# Patient Record
Sex: Female | Born: 1950 | Race: Black or African American | Hispanic: No | Marital: Married | Smoking: Never smoker
Health system: Southern US, Community
[De-identification: ages and names within clinical notes are randomized; demographics above are authoritative.]

## PROBLEM LIST (undated history)

## (undated) DIAGNOSIS — K219 Gastro-esophageal reflux disease without esophagitis: Secondary | ICD-10-CM

## (undated) DIAGNOSIS — I10 Essential (primary) hypertension: Secondary | ICD-10-CM

## (undated) DIAGNOSIS — M1990 Unspecified osteoarthritis, unspecified site: Secondary | ICD-10-CM

## (undated) HISTORY — PX: CHOLECYSTECTOMY: SHX55

## (undated) HISTORY — PX: ABDOMINAL HYSTERECTOMY: SHX81

## (undated) HISTORY — PX: TONSILLECTOMY: SUR1361

---

## 1999-04-15 ENCOUNTER — Other Ambulatory Visit: Admission: RE | Admit: 1999-04-15 | Discharge: 1999-04-15 | Payer: Self-pay | Admitting: Gynecology

## 1999-10-15 ENCOUNTER — Encounter (INDEPENDENT_AMBULATORY_CARE_PROVIDER_SITE_OTHER): Payer: Self-pay

## 1999-10-15 ENCOUNTER — Inpatient Hospital Stay (HOSPITAL_COMMUNITY): Admission: RE | Admit: 1999-10-15 | Discharge: 1999-10-16 | Payer: Self-pay | Admitting: Gynecology

## 2000-01-25 ENCOUNTER — Encounter: Payer: Self-pay | Admitting: *Deleted

## 2000-01-25 ENCOUNTER — Encounter: Admission: RE | Admit: 2000-01-25 | Discharge: 2000-01-25 | Payer: Self-pay | Admitting: *Deleted

## 2000-07-27 ENCOUNTER — Ambulatory Visit (HOSPITAL_COMMUNITY): Admission: RE | Admit: 2000-07-27 | Discharge: 2000-07-27 | Payer: Self-pay | Admitting: Gastroenterology

## 2000-10-06 ENCOUNTER — Encounter: Payer: Self-pay | Admitting: *Deleted

## 2000-10-06 ENCOUNTER — Encounter: Admission: RE | Admit: 2000-10-06 | Discharge: 2000-10-06 | Payer: Self-pay | Admitting: *Deleted

## 2002-05-06 ENCOUNTER — Other Ambulatory Visit: Admission: RE | Admit: 2002-05-06 | Discharge: 2002-05-06 | Payer: Self-pay | Admitting: Family Medicine

## 2003-04-14 ENCOUNTER — Encounter: Payer: Self-pay | Admitting: Emergency Medicine

## 2003-04-14 ENCOUNTER — Emergency Department (HOSPITAL_COMMUNITY): Admission: EM | Admit: 2003-04-14 | Discharge: 2003-04-14 | Payer: Self-pay | Admitting: Emergency Medicine

## 2004-08-17 ENCOUNTER — Ambulatory Visit (HOSPITAL_COMMUNITY): Admission: RE | Admit: 2004-08-17 | Discharge: 2004-08-17 | Payer: Self-pay | Admitting: Gastroenterology

## 2005-09-19 HISTORY — PX: KNEE ARTHROPLASTY: SHX992

## 2006-03-31 ENCOUNTER — Inpatient Hospital Stay (HOSPITAL_COMMUNITY): Admission: RE | Admit: 2006-03-31 | Discharge: 2006-04-04 | Payer: Self-pay | Admitting: Specialist

## 2008-10-18 ENCOUNTER — Emergency Department (HOSPITAL_COMMUNITY): Admission: EM | Admit: 2008-10-18 | Discharge: 2008-10-18 | Payer: Self-pay | Admitting: Emergency Medicine

## 2008-11-29 ENCOUNTER — Emergency Department (HOSPITAL_COMMUNITY): Admission: EM | Admit: 2008-11-29 | Discharge: 2008-11-29 | Payer: Self-pay | Admitting: Family Medicine

## 2009-12-27 ENCOUNTER — Emergency Department (HOSPITAL_COMMUNITY): Admission: EM | Admit: 2009-12-27 | Discharge: 2009-12-27 | Payer: Self-pay | Admitting: Family Medicine

## 2010-12-21 ENCOUNTER — Other Ambulatory Visit: Payer: Self-pay | Admitting: Gastroenterology

## 2010-12-27 ENCOUNTER — Ambulatory Visit
Admission: RE | Admit: 2010-12-27 | Discharge: 2010-12-27 | Disposition: A | Discharge status: Home / Self Care | Payer: BLUE CROSS/BLUE SHIELD | Source: Ambulatory Visit | Attending: Gastroenterology | Admitting: Gastroenterology

## 2010-12-30 LAB — POCT RAPID STREP A (OFFICE): Streptococcus, Group A Screen (Direct): NEGATIVE

## 2011-02-04 NOTE — Discharge Summary (Signed)
[facility]  Patient:    Martha Chambers                       MRN: [id number] Adm. Date:  [id number] Disc. Date: [id number] Attending:  Merrily Pew Dictator:   Gwendolyn Fill. Gatewood, P.A.                           Discharge Summary  DISCHARGE DIAGNOSES:          1. Uterine prolapse.                               2. Cystocele.                               3. Leiomyomata.                               4. Status post total vaginal hysterectomy, anterior                                  colporrhaphy by Timothy P. Fontaine, M.D. on                                  October 15, 1999.  HISTORY OF PRESENT ILLNESS:   This is a 60 year old female, gravida 3, para 3, ith uterine prolapse, cystocele, increased symptoms of vaginal pressure, something falling out, deep dyspareunia, urinary retention.  Also was found to have uterus measuring 11.7 with subserosal fibroid measuring 8 cm.  The patient was admitted for definitive surgery.  HOSPITAL COURSE:              On October 15, 1999, the patient was admitted and  underwent a total vaginal hysterectomy, anterior colporrhaphy by Timothy P. Fontaine, M.D. without complications.  The patient remained afebrile, voiding, nd in stable condition and was discharged to home on October 16, 1999, and given [facility] Gynecology postoperative instructions as well as postoperative booklet.  ACCESSORY CLINICAL DATA:      On October 16, 1999, hemoglobin was 10.4, hematocrit 30.4.  DISPOSITION:                  The patient was discharged to home and given a prescription for Lortab-5 #15 p.r.n. pain and was to return to the office in two weeks for follow-up. DD:  11/01/99 TD:  11/01/99 Job: [id number]

## 2011-02-04 NOTE — Op Note (Signed)
[facility]  Patient:    Martha Chambers                       MRN: [id number] Proc. Date: 10/15/99 Adm. Date:  [id number] Attending:  Merrily Pew                           Operative Report  PREOPERATIVE DIAGNOSIS:           Uterine prolapse, cystocele, leiomyomata.  POSTOPERATIVE DIAGNOSIS:          Uterine prolapse, cystocele, leiomyomata.  OPERATION:                        Total vaginal hysterectomy, anterior  colporrhaphy.  SURGEON:                          Timothy P. Fontaine, M.D.  ASSISTANT:                        Rande Brunt. Eda Paschal, M.D.  ANESTHESIA:                       General endotracheal.  COMPLICATIONS:                    None.  ESTIMATED BLOOD LOSS:             500 cc.  SPECIMEN:                         Uterus.  FINDINGS:                         Enlarged uterus with changes consistent with leiomyomata, right ovary not visualized, palpably normal.  Left ovary visualized and normal.  PROCEDURE:                        The patient was taken to the operating room, placed in the dorsal lithotomy position, and received a perineal/vaginal preparation with Betadine scrub and Betadine solution.  The bladder was emptied  with in and out Foley catheterization.  The patient was draped in the usual fashion.  Using latex precautions throughout the case, the cervix was visualized with a weighted speculum, grasped with a tenaculum and circumferentially injected with lidocaine with epinephrine.  The cervical mucosa was then circumferentially sharply incised and the pericervical planes were developed with Mayo scissors. The posterior cul-de-sac was identified and sharply incised and a long weighted speculum was placed.  The anterior cul-de-sac was developed.  The right and left uterosacral ligaments were identified, clamped, cut and ligated using 0 Vicryl suture.  The anterior peritoneum was identified and sharply incised and  the anterior cul-de-sac was entered without difficulty.  The uterus was then progressively freed from its attachments through clamping, cutting and ligating of the parametrial cardinal ligaments using 0 Vicryl suture.  The right and left uterine vessels were identified and doubly ligated using 0 Vicryl suture.  Upon  attempts at delivery of the uterus, it became evident that morcellization was needed and, through progressive incisions into the uterus and delivery of the myometrial and leiomyomata fragments, the uterus was ultimately delivered and the right and left uterine/ovarian pedicles were clamped and cut, freeing the specimen. Examination  of the adnexa showed no clear right ovary, although palpably felt normal and the pedicle was doubly ligated using 0 Vicryl suture.  The left ovary and fallopian tube were visualized, although felt to be high and the decision was made not to attempt excision, noting that, visually, this was a very normal-appearing ovary and tube.  This pedicle was doubly ligated using 0 Vicryl suture.  The long weighted speculum was then removed.  The intestines were packed with a ______  sponge and the posterior vaginal cuff was run using 0 Vicryl suture in a running, interlocking stitch.  The bowel packing was removed, adequate hemostasis visualized at all pedicles and the vagina was laterally reapproximated anterior to posterior with interrupted figure-of-eight 0 Vicryl sutures.  The mid vagina was then grasped with Allis clamps and the anterior vaginal wall was sharply incised with Metzenbaum scissors progressively with traction from serial Allis clamp application.  The bladder was then sharply dissected from the anterior vaginal wall with a good development of a vesicovaginal plane.  The cystocele was then reduced through progressive imbrication reapproximating the vesicovaginal fascia using 2-0 Vicryl suture.  Subsequently, the excess vaginal mucosa  was excised and the vaginal mucosa was reapproximated using 2-0 Vicryl in a running  interlocking stitch anchoring this to the underlying fascia.  The cuff was then  completely closed using the 2-0 Vicryl.  The vagina was irrigated and plain gauze packing placed due to the patients multiple allergies.  A Foley catheter was introduced, clear free-flowing urine was noted.  The patient was awakened without difficulty and taken to the recovery room in good condition. DD:  10/15/99 TD:  10/17/99 Job: [id number]

## 2011-02-04 NOTE — Op Note (Signed)
NAME:  Martha Chambers, CORLISS NO.:  [id number]   MEDICAL RECORD NO.:  [id number]          PATIENT TYPE:  INP   LOCATION:  1517                         FACILITY:  [facility]   PHYSICIAN:  Erasmo Leventhal, M.D.DATE OF BIRTH:  22-Mar-1951   DATE OF PROCEDURE:  03/31/2006  DATE OF DISCHARGE:                                 OPERATIVE REPORT   PREOPERATIVE DIAGNOSIS:  Right knee end-stage osteoarthritis.   POSTOPERATIVE DIAGNOSIS:  Right knee severe end-stage osteoarthritis.   PROCEDURE:  Right total knee arthroplasty.   SURGEON:  Valma Cava, MD   ASSISTANT:  Jodene Nam, PA-C   ANESTHESIA:  Spinal.   ESTIMATED BLOOD LOSS:  Less than 100 mL.   DRAINS:  Two mini Hemovac.   COMPLICATIONS:  None.   TOURNIQUET TIME:  1 hour and 30 minutes at 350 mmHg.   DISPOSITION:  To PACU stable.   OPERATIVE IMPLANTS:  De[employer] & [employer], all  cemented.  Size 4 femur, size 3 tibia, 12.5 tibial insert, 35 patella.   OPERATIVE DETAILS:  The patient was counseled in the holding area, and  correct site was identified.  IV was started; antibiotics were given.  Taken  to the OR, placed supine.  Inguinal nerve block was also administered  preoperatively.  Placed in supine position, general anesthesia.  Well-padded  bump.  Foley catheter was placed utilizing sterile technique by the OR  circulating nurse.  The right lower extremity is elevated, prepped with  DuraPrep, and draped in a sterile fashion.  Exsanguinated with an Esmarch.  Tourniquet is inflated to 350 mm of mmHg.  Straight midline incision made  through the skin and subcutaneous tissue.  Small bleeders were coagulated.  Medial parapatellar arthrotomy was performed.  Proximal and medial tibia  soft tissue release was done.  The patella was retracted out of the way,  knee was flexed.  End-stage arthritis change, bone-on-bone.  Cruciate  ligaments were resected, starter hole made in the distal  femur.  Canal was  irrigated until the effluent was clear.  Intramedullary rod was gently  placed.  We chose a 5-degree valgus cut with a 10 mm cut off the distal  femur.  The distal femur was found to be a size #4.  Rotational marks were  made and cut to fit a size #4.  Medial and lateral menisci were removed  under direct visualization.  Geniculate vessels were coagulated.  Posterior  neurovascular structures were brought up and protected throughout the entire  case.  The tibial eminence was resected.  Osteophytes were removed from the  proximal tibia.  The proximal tibia was found to be a size 3.  Starter holes  made.  Step reamer was utilized.  Canal was irrigated until the effluent was  clear, and intramedullary rod was gently placed.  This was done after making  sure the canal was well irrigated and vented.  I chose a 2 mm cut based off  the lateral side at a 0-degree slope.  Posteromedial and posterolateral  femoral osteophytes were removed under direct visualization.  At this time,  the spacer blocks were okay in extension, but we were tight in flexion.  Based upon that, the knee was flexed with 2 more mm taken off the proximal  tibia.  We now had balance in flexion and extension.  The tibia was then  subluxed forward.  The tibial baseplate was applied, set for correct  rotational coverage.  Pins were placed.  Reamer and punch was applied.  The  femoral box cut was performed in standard fashion.  At this time, the size 4  femur, size 3 tibia, with a 10 insert, and we had good range of motion and  soft tissue balance.  Patella was found to be a size 35.  Appropriate amount  of bone was resected.  Locking holes were made.  Patellofemoral tracking was  anatomic.  All trials were then removed.  The knee was irrigated with  pulsatile lavage while the cement was mixed.  Utilizing modern cement  technique, all components were cemented in place.  Size 3 tibia, size 4  femur, with the 35  patella.  After the cement had cured, excess cement was  meticulously removed.  We did trials in 10 and 12.5, and the 12.5 mm tibial  insert.  We had full extension.  We were well-balanced in flexion and  extension gaps.  We were stable to varus and valgus stress.  The  __________track was anatomic.  The knee was then flexed.  Trial was removed  and the final 12.5 mm posterior stabilized rotating platform tibia insert  was implanted.  The knee was again rechecked, found to be well-balanced,  excellent condition and alignment, irrigated.  With pulsatile lavage,  finally placed in tibial implant.  Two mini Hemovac drains were placed and  sequential closure of layers done.  The arthrotomy with Vicryl, subcu  Vicryl, skin was closed with subcuticular Monocryl suture.  Steri-Strips  were applied, drain was hooked to suction.  Sterile dressing applied to the  knee.  Tourniquet had been deflated.  There was normal circulation in the  foot and ankle at the end of the case.  She was awakened and taken from the  operating room to recovery room in stable condition.   To help with surgical retraction, decision making, Mr. Layla Maw PA  assistance was needed throughout the entire case.           ______________________________  Erasmo Leventhal, M.D.     RAC/MEDQ  D:  03/31/2006  T:  03/31/2006  Job:  [id number]

## 2011-02-04 NOTE — H&P (Signed)
NAME:  Martha Chambers, [name]:  [id number]   MEDICAL RECORD NO.:  [id number]           PATIENT TYPE:   LOCATION:                                 FACILITY:   PHYSICIAN:  Erasmo Leventhal, M.D.DATE OF BIRTH:  23-Dec-1950   DATE OF ADMISSION:  03/31/2006  DATE OF DISCHARGE:                                HISTORY & PHYSICAL   CHIEF COMPLAINT:  End-stage osteoarthritis right knee.   BRIEF HISTORY:  This is a 60 year old lady with a history of osteoarthritis  of her right knee. She has had conservative treatment with cortisone  injections, viscosupplementation therapy, all without relief of her  symptoms. Due to her continued pain and difficulty with ambulation and daily  activities, she is now scheduled for total knee arthroplasty of the right  knee. The surgery, risks, and benefits of after care were discussed in  detail with the patient, questions provided and answered, and surgery to go  ahead as scheduled. She is to receive medical clearance from her medical  doctor, Dr. Candyce Churn.   PAST MEDICAL HISTORY:  Serious medical illnesses include hypertension and  reflux. Previous surgeries include hysterectomy, tonsillectomy, and  cholecystectomy.   CURRENT MEDICATIONS:  1.  Diovan/hydrochlorothiazide 320/25 mg one daily  2.  Vicodin one q.6h. p.r.n. pain.  3.  Aspirin 81 mg daily.  4.  Nexium 40 mg daily.   ALLERGIES:  SULFA with a rash; CODEINE with nausea and vomiting; CEFTIN with  nausea and vomiting; and CIPRO with a rash.   FAMILY HISTORY:  Positive for coronary artery disease, diabetes, and  sarcoidosis.   SOCIAL HISTORY:  The patient is married. She works as a Public house manager. She does not  smoke or drink.   REVIEW OF SYSTEMS:  CENTRAL NERVOUS SYSTEM:  Negative for headache, blurred  vision, or dizziness. PULMONARY: No shortness of breath, PND, or orthopnea.  CARDIOVASCULAR: Positive for palpitations with caffeine drinks. Negative for  chest pain.  GI: Positive for reflux. Negative ulcers. GU:  Negative for  urinary tract difficulty. MUSCULOSKELETAL: Positive as in HPI.   PHYSICAL EXAMINATION:  VITAL SIGNS: BP 110/76, respirations 16, pulse 82 and  regular.  GENERAL APPEARANCE:  This is an obese lady in no acute distress.  HEENT: Head is normocephalic. Nose patent. Ears patent. Pupils equal, round,  and reactive to light. Throat without injection.  NECK: Supple without adenopathy. Carotids are 2+ without bruits.  CHEST: Clear to auscultation. No rales or rhonchi. Respirations 16.  HEART: Regular rate and rhythm, 82 beats per minute without murmur.  ABDOMEN: Soft with active bowel sounds. No masses or organomegaly.  NEUROLOGIC: The patient is alert and oriented to time, place, and person.  Cranial nerves II-XII grossly intact.  EXTREMITIES: Within normal limits. The right knee shows varus deformity, 0  to 110 degrees range of motion. Dorsalis pedis and posterior tibialis pulses  are 1+ and sensation and circulation are intact.   X-rays show end-stage osteoarthritis, right knee.   IMPRESSION:  End-stage osteoarthritis, right knee.   PLAN:  Total knee arthroplasty, right knee.  Martha Chambers. Chabon, P.A.    ______________________________  Erasmo Leventhal, M.D.    SJC/MEDQ  D:  03/08/2006  T:  03/08/2006  Job:  [id number]

## 2011-02-04 NOTE — Discharge Summary (Signed)
NAME:  Martha Chambers, Martha Chambers NO.:  [id number]   MEDICAL RECORD NO.:  [id number]          PATIENT TYPE:  INP   LOCATION:  1517                         FACILITY:  [facility]   PHYSICIAN:  Erasmo Leventhal, M.D.DATE OF BIRTH:  02-09-1951   DATE OF ADMISSION:  03/31/2006  DATE OF DISCHARGE:  04/04/2006                                 DISCHARGE SUMMARY   ADMITTING DIAGNOSIS:  End-stage osteoarthritis right knee.   DISCHARGE DIAGNOSIS:  End-stage osteoarthritis right knee.   OPERATION:  Total knee arthroplasty right knee.   BRIEF HISTORY:  This is a 60 year old white lady with a history of  osteoarthritis of her right knee who failed conservative management.  After  discussion of treatment, benefits, risks, and options patient now scheduled  for total knee arthroplasty.  She has got medical clearance from Dr. Johnella Moloney, her medical doctor, and surgery will go ahead as scheduled.   LABORATORY VALUES:  Admission CBC within normal limits.  Hemoglobin and  hematocrit reached a low of 9.2 and 27.6 on the 16th.  Admission BMET within  normal limits with the exception of slightly high glucose at 108.  She had  mild hyponatremia on the 14th at 133 which was corrected by the 16th.  Ran  mildly elevated glucose throughout admission.  BUN was slightly low and  calcium slightly low through postoperative period.  Admission PT/PTT within  normal limits and INR was 2.1 at discharge.  Urinalysis negative.   HOSPITAL COURSE:  Patient tolerated the operative procedure well.  First  postoperative day patient was comfortable.  Vital signs were stable,  afebrile.  Hemovac was discontinued without difficulty.  Second  postoperative day vital signs were stable.  Temperature to 100.1.  Dressing  was changed.  Wound was benign.  Calves were negative.  Pulmonary toilet was  increased.  Third postoperative day she was feeling okay, had moderate pain.  Vital signs stable, afebrile.  Lungs  clear.  Heart sounds normal.  Bowel  sounds active.  Calves were negative.  Dressing was changed and the wound  was benign.  Plans were made for discharge in the a.m. if stable and on April 04, 2006 patient was feeling good.  Vital signs were stable.  Temperature to  a 99.7 max.  INR 2.1.  Lungs clear.  Heart sounds normal.  Bowel sounds  active.  Calves negative.  The wound benign.  The patient subsequently  discharged home for follow-up in the office.   CONDITION ON DISCHARGE:  Improved.   DISCHARGE MEDICATIONS:  1.  Mepergan Fortis one q.6h. p.r.n. pain.  2.  Robaxin 500 one p.o. q.8h. p.r.n. spasm.  3.  Trinsicon one b.i.d. for anemia.  4.  Coumadin per pharmacy protocol.   DISCHARGE INSTRUCTIONS:  She is to use her walker, do her physical therapy  as directed, return to the office in 10 days for recheck or sooner p.r.n.  problems.      [name]. Chabon, P.A.    ______________________________  Erasmo Leventhal, M.D.    SJC/MEDQ  D:  04/18/2006  T:  04/18/2006  Job:  [id number]

## 2011-02-04 NOTE — Op Note (Signed)
NAME:  Martha Chambers, Martha Chambers               ACCOUNT NO.:  [id number]   MEDICAL RECORD NO.:  [id number]          PATIENT TYPE:  AMB   LOCATION:  ENDO                         FACILITY:  [facility]   PHYSICIAN:  Jordan Hawks. Elnoria Howard, MD    DATE OF BIRTH:  Dec 12, 1950   DATE OF PROCEDURE:  08/17/2004  DATE OF DISCHARGE:                                 OPERATIVE REPORT   PROCEDURE PERFORMED:  Esophagogastroduodenoscopy.   ENDOSCOPIST:  Jordan Hawks. Elnoria Howard, MD   INSTRUMENT USED:  Olympus adult endoscope.   INDICATIONS FOR PROCEDURE:  For dysphagia.   PHYSICAL EXAMINATION:  LUNGS:  Clear to auscultation bilaterally.  CARDIOVASCULAR:  Regular rate and rhythm.  ABDOMEN:  Soft, nontender, nondistended.   MEDICATIONS:  Demerol 60 mg IV, Versed 6 mg IV.   CONSENT:  Informed consent was obtained from the patient, stating the risks  of bleeding, infection, perforation, medication reactions, and the risk of  death, all of which are not exclusive of any other complications that can  occur.   DESCRIPTION OF PROCEDURE:  After adequate sedation was achieved, the  endoscope was advanced from the oral cavity to the duodenum under direct  visualization without difficulty.  The duodenum appeared normal.  There was  no evidence of gross abnormalities upon withdrawal and inspection of the  gastric lumen. There was no evidence of any gross abnormalities in this  location, i.e. no evidence of any masses, ulcerations, erosions.  The  endoscope was retroflexed and there was no evidence of any abnormalities of  the cardia region.  The endoscope was straightened and withdrawn into the  esophagitis and the Z-line was noted to be at 38 cm.  No evidence of any  esophagitis or hiatal hernia.  The endoscope was then withdrawn from the  patient and the procedure was terminated.  The patient tolerated the  procedure well.   PLAN:  Continue with proton pump inhibitor twice a day and return to clinic.       PDH/MEDQ  D:  08/17/2004   T:  08/17/2004  Job:  [id number]

## 2011-02-04 NOTE — Procedures (Signed)
[facility]  Patient:    Martha Chambers, Martha Chambers                      MRN: [id number] Proc. Date: 07/27/00 Adm. Date:  [id number] Attending:  Charna Elizabeth CC:         Heather Roberts, M.D.   Procedure Report  DATE OF BIRTH:  03-Feb-1951  PROCEDURE PERFORMED:  Colonoscopy.  ENDOSCOPIST:  Anselmo Rod, M.D.  INSTRUMENT USED:  Olympus video colonoscope.  INDICATIONS FOR PROCEDURE:  Rectal bleeding with diarrhea in a 60 year old African-American female, rule out colon polyps, masses, hemorrhoids, etc.  PREPROCEDURE PREPARATION:  Informed consent was procured from the patient. The patient was fasted for eight hours prior to the procedure, and prepped with a bottle of magnesium citrate and a gallon of NuLytely the night prior to the procedure.  PREPROCEDURE PHYSICAL:  VITAL SIGNS:  Stable.  NECK:  Supple.  CHEST:  Clear to auscultation, S1 and S2 regular.  ABDOMEN:  Soft with normal bowel sounds.  DESCRIPTION OF PROCEDURE:  The patient was placed in the left lateral decubitus position, and sedated with 90 mg of Demerol and 9 mg of Versed intravenously.  Once the patient was adequately sedated, maintained on low flow oxygen and continuous cardiac monitoring, the Olympus video colonoscope was advanced from the rectum to the cecum with difficulty secondary to a large amount of residual stool in the colon, especially in the right colon.  There were small nonbleeding internal hemorrhoids seen on retroflexion.  No large masses or polyps were seen.  A very small lesion may have been missed secondary to relatively poor prep.  IMPRESSION: 1. Essentially unrevealing colonoscopy with some residual stool in the colon,    especially on the right side. 2. Small nonbleeding internal hemorrhoids. 3. No large masses or polyps seen.  RECOMMENDATIONS: 1. The patient has been advised to increase the fluid and fiber in her diet. 2. Outpatient follow up is  advised in the next two weeks.  Further    recommendations ______ at that time. DD:  07/27/00 TD:  07/28/00 Job: [id number]

## 2011-08-30 ENCOUNTER — Encounter (HOSPITAL_COMMUNITY): Payer: Self-pay | Admitting: Pharmacy Technician

## 2011-09-08 NOTE — H&P (Signed)
  [facility] [address]   [address] [place], [place] [address] [phone] A Division of [facility]  Daniel F. Murphy, M.D.     Robert A. Wainer, M.D.     Anna Voytek, M.D. Joshua P. Landau, M.D.    [place] R. Ibazebo, M.D. Jafar Poffenberger S. Kramer, M.D. Timothy R. Draper, D.O.          Brock Larmon M. Lynk Marti, PA-C            Kirstin A. Shepperson, PA-C Brandon Parry, OPA-C   RE: Robitaille, Bisma                                [id number]      DOB: 04/18/1951 PROGRESS NOTE: 09-06-11 Chief complaint: Left knee pain.  History of present illness: [age] year-old black female with a history of end stage DJD, left knee, and chronic pain.  Returns.  States that symptoms are unchanged from previous visit.  She is wanting to proceed with total knee replacement as scheduled.   Current medications: Omeprazole, Diovan, HCT and Flexeril. Allergies: Ceftin (rash), Cipro (rash), Codeine (nausea and vomiting, Sulfa (rash), ACE inhibitors (cough) and Advil (indigestion).      Past medical/surgical history: Hypertension, diverticulosis, obesity, bilateral knee DJD, left breast biopsy, back pain and GERD. Review of systems: Patient denies lightheadedness, dizziness, fevers, chills, cardiac, pulmonary, GI, GU or neuro issues.   Family history: Positive for diabetes and cancer.  Social history: Does not smoke or drink.  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     EXAMINATION: Height: 5?4.  Weight: 254 pounds.  Respirations: 24.  Temperature: 98.4.  Blood pressure: 140/86.  Pulse: 86.  Pleasant black female, alert and oriented x 3 and in no acute distress.  Gait is normal.  Head is normocephalic, a traumatic.  PERRLA, EOMI.  Lungs: CTA bilaterally.  No wheezes.  Heart: RRR.  S1 and S2.  No murmurs noted.  Cervical spine unremarkable.  Abdomen: Round and non-distended.  NBS x 4.  Soft and non-tender.  Left knee: Decreased range of motion.   Positive crepitus.  Joint line tender.  1-2+ effusion.  Ligaments stable.  Bilateral calves non-tender.  Neurovascularly intact.  Skin warm and dry.   IMPRESSION: End stage DJD, left knee, and chronic pain.    PLAN:  We will proceed with left total knee replacement as scheduled.  Surgical procedure, along with potential rehab/recovery time discussed.  All questions answered.    Daniel F. Murphy, M.D.   Electronically verified by Daniel F. Murphy, M.D. DFM(JMO):jjh D 09-06-11 T 09-07-11 

## 2011-09-09 ENCOUNTER — Encounter (HOSPITAL_COMMUNITY)
Admission: RE | Admit: 2011-09-09 | Discharge: 2011-09-09 | Disposition: A | Discharge status: Home / Self Care | Payer: BC Managed Care – PPO | Source: Ambulatory Visit | Attending: Surgery | Admitting: Surgery

## 2011-09-09 ENCOUNTER — Encounter (HOSPITAL_COMMUNITY): Payer: Self-pay

## 2011-09-09 ENCOUNTER — Encounter (HOSPITAL_COMMUNITY)
Admission: RE | Admit: 2011-09-09 | Discharge: 2011-09-09 | Disposition: A | Discharge status: Home / Self Care | Payer: BC Managed Care – PPO | Source: Ambulatory Visit | Attending: Orthopedic Surgery | Admitting: Orthopedic Surgery

## 2011-09-09 ENCOUNTER — Other Ambulatory Visit: Payer: Self-pay

## 2011-09-09 HISTORY — DX: Gastro-esophageal reflux disease without esophagitis: K21.9

## 2011-09-09 HISTORY — DX: Essential (primary) hypertension: I10

## 2011-09-09 HISTORY — DX: Unspecified osteoarthritis, unspecified site: M19.90

## 2011-09-09 LAB — TYPE AND SCREEN
ABO/RH(D): O POS
Antibody Screen: NEGATIVE

## 2011-09-09 LAB — COMPREHENSIVE METABOLIC PANEL
AST: 15 U/L (ref 0–37)
CO2: 25 mEq/L (ref 19–32)
Calcium: 9.4 mg/dL (ref 8.4–10.5)
Creatinine, Ser: 0.59 mg/dL (ref 0.50–1.10)
GFR calc non Af Amer: >90 mL/min (ref >90)
Total Protein: 7.1 g/dL (ref 6.0–8.3)

## 2011-09-09 LAB — CBC
MCH: 29.9 pg (ref 26.0–34.0)
MCHC: 33.3 g/dL (ref 30.0–36.0)
MCV: 89.7 fL (ref 78.0–100.0)
Platelets: 310 10*3/uL (ref 150–400)
RBC: 4.35 MIL/uL (ref 3.87–5.11)
RDW: 13.3 % (ref 11.5–15.5)

## 2011-09-09 LAB — PROTIME-INR: INR: 1.08 (ref 0.00–1.49)

## 2011-09-09 LAB — URINALYSIS, ROUTINE W REFLEX MICROSCOPIC
Glucose, UA: NEGATIVE mg/dL
Hgb urine dipstick: NEGATIVE
Ketones, ur: NEGATIVE mg/dL
Protein, ur: NEGATIVE mg/dL

## 2011-09-09 LAB — URINE MICROSCOPIC-ADD ON

## 2011-09-09 MED ORDER — CHLORHEXIDINE GLUCONATE 4 % EX LIQD: 60.0000 mL | Freq: Once | CUTANEOUS | Status: DC

## 2011-09-09 NOTE — Pre-Procedure Instructions (Signed)
[name]  09/09/2011   Your procedure is scheduled on:09/21/11  Report to [facility] a800 AM.  Call this number if you have problems the morning of surgery: [phone]   Remember:   Do not eat food:After Midnight.  May have clear liquids: up to 4 Hours before arrival.  Clear liquids include soda, tea, black coffee, apple or grape juice, broth.  Take these medicines the morning of surgery with A SIP OF WATER: prilosec, ultram   Stop asa ,naprosyn 09/14/11   Do not wear jewelry, make-up or nail polish.  Do not wear lotions, powders, or perfumes. You may wear deodorant.  Do not shave 48 hours prior to surgery.  Do not bring valuables to the hospital.  Contacts, dentures or bridgework may not be worn into surgery.  Leave suitcase in the car. After surgery it may be brought to your room.  For patients admitted to the hospital, checkout time is 11:00 AM the day of discharge.   Patients discharged the day of surgery will not be allowed to drive home.  Name and phone number of your driver: Christen Bame [phone]  Special Instructions: CHG Shower Use Special Wash: 1/2 bottle night before surgery and 1/2 bottle morning of surgery.   Please read over the following fact sheets that you were given: Pain Booklet, Coughing and Deep Breathing, Blood Transfusion Information, MRSA Information and Surgical Site Infection Prevention

## 2011-09-09 NOTE — Consult Note (Addendum)
Anesthesia:  Patient is a 60 year old female scheduled for a left TKA on 09/21/11 by Dr. Eulah Pont.  Her history includes HTN, obesity, GERD, and OA.  She is a non-smoker.    Her labs are unremarkable except her urine has moderate leukocytes, but no nitrites.  Her CXR report showed no acute findings.  I was asked to review her preoperative EKG showing possible anterior infarct, poor R wave progression.  This appears new since 04/14/03. She did not report any CV symptoms to her PAT nurse.  I reviewed her history and EKG with Anesthesiologist Dr. Ivin Booty.  It is possible that her EKG changes are related to lead placement, however, she does have a few CV risk factors.  Anesthesia is requesting either her EKG be repeated to confirm results or that she be referred to a Cardiologist preoperatively to determine if additional testing is warranted.  I discussed the above options with Olegario Messier at Dr. Greig Right office.   She arranged an appointment for the patient with her PCP Dr. Johnella Moloney on 09/14/11 at 11:30AM.  She will get her EKG repeated at that visit.  Additional recommendations depending on those results.     Addendum: 09/19/11 0940  As planned, patient was seen by Dr. Kevan Ny on 09/14/11.  Her EKG looked similar to the EKG done here on 09/09/11.  Subsequently, he had her get an echocardiogram which showed an EF of 64.6%, mild LVH, trace MR/PR, mild TR, findings suggestive of grade 1 diastolic dysfunction without elevated LA pressure.  I had Anesthesiologist Dr. Katrinka Blazing review these results.  If Ms. Mckiver remains asymptomatic then plan to proceed.  Sherri at Dr. Greig Right office updated.

## 2011-09-09 NOTE — Progress Notes (Addendum)
Called dr Ezekiel Ina gates office req prior ekg. Message left on machine. Clydie Braun returned call. No ekg on record.chart left for anesthesia to review.

## 2011-09-15 NOTE — H&P (Signed)
  [facility] [address]   [address] [place], [place] [address] [phone] A Division of [facility]  Loreta Ave, M.D.     Robert A. Thurston Hole, M.D.     Lunette Stands, M.D. Eulas Post, M.D.    Buford Dresser, M.D. Estell Harpin, M.D. Ralene Cork, D.O.          Genene Churn. Barry Dienes, PA-C            Kirstin A. Shepperson, PA-C [place], OPA-C   RE: Martha Chambers, Martha Chambers                                [phone]      DOB: 1950-11-15 PROGRESS NOTE: 09-06-11 Chief complaint: Left knee pain.  History of present illness: 25 two year-old black female with a history of end stage DJD, left knee, and chronic pain.  Returns.  States that symptoms are unchanged from previous visit.  She is wanting to proceed with total knee replacement as scheduled.   Current medications: Omeprazole, Diovan, HCT and Flexeril. Allergies: Ceftin (rash), Cipro (rash), Codeine (nausea and vomiting, Sulfa (rash), ACE inhibitors (cough) and Advil (indigestion).      Past medical/surgical history: Hypertension, diverticulosis, obesity, bilateral knee DJD, left breast biopsy, back pain and GERD. Review of systems: Patient denies lightheadedness, dizziness, fevers, chills, cardiac, pulmonary, GI, GU or neuro issues.   Family history: Positive for diabetes and cancer.  Social history: Does not smoke or drink.  Patient is married and employed as an Public house manager at [employer].     EXAMINATION: Height: 5?4.  Weight: 254 pounds.  Respirations: 24.  Temperature: 98.4.  Blood pressure: 140/86.  Pulse: 86.  Pleasant black female, alert and oriented x 3 and in no acute distress.  Gait is normal.  Head is normocephalic, a traumatic.  PERRLA, EOMI.  Lungs: CTA bilaterally.  No wheezes.  Heart: RRR.  S1 and S2.  No murmurs noted.  Cervical spine unremarkable.  Abdomen: Round and non-distended.  NBS x 4.  Soft and non-tender.  Left knee: Decreased range of motion.   Positive crepitus.  Joint line tender.  1-2+ effusion.  Ligaments stable.  Bilateral calves non-tender.  Neurovascularly intact.  Skin warm and dry.   IMPRESSION: End stage DJD, left knee, and chronic pain.    PLAN:  We will proceed with left total knee replacement as scheduled.  Surgical procedure, along with potential rehab/recovery time discussed.  All questions answered.    Loreta Ave, M.D.   Electronically verified by Loreta Ave, M.D. DFM(JMO):jjh D 09-06-11 T 09-07-11

## 2011-09-20 MED ORDER — VANCOMYCIN HCL 1000 MG IV SOLR
1500.0000 mg | INTRAVENOUS | Status: AC
  Administered 2011-09-21: 1500 mg via INTRAVENOUS
  Filled 2011-09-20 (×2): qty 1500

## 2011-09-21 ENCOUNTER — Encounter (HOSPITAL_COMMUNITY): Payer: Self-pay | Admitting: Vascular Surgery

## 2011-09-21 ENCOUNTER — Inpatient Hospital Stay (HOSPITAL_COMMUNITY)
Admission: RE | Admit: 2011-09-21 | Discharge: 2011-09-23 | DRG: 209 | Disposition: A | Discharge status: Home Health | Payer: BC Managed Care – PPO | Source: Ambulatory Visit | Attending: Orthopedic Surgery | Admitting: Orthopedic Surgery

## 2011-09-21 ENCOUNTER — Inpatient Hospital Stay (HOSPITAL_COMMUNITY): Payer: BC Managed Care – PPO | Admitting: Vascular Surgery

## 2011-09-21 ENCOUNTER — Encounter (HOSPITAL_COMMUNITY): Payer: Self-pay

## 2011-09-21 ENCOUNTER — Encounter (HOSPITAL_COMMUNITY)
Admission: RE | Disposition: A | Discharge status: Home Health | Payer: Self-pay | Source: Ambulatory Visit | Attending: Orthopedic Surgery

## 2011-09-21 ENCOUNTER — Inpatient Hospital Stay (HOSPITAL_COMMUNITY): Payer: BC Managed Care – PPO

## 2011-09-21 DIAGNOSIS — I10 Essential (primary) hypertension: Secondary | ICD-10-CM | POA: Diagnosis present

## 2011-09-21 DIAGNOSIS — Z886 Allergy status to analgesic agent status: Secondary | ICD-10-CM

## 2011-09-21 DIAGNOSIS — Z96659 Presence of unspecified artificial knee joint: Secondary | ICD-10-CM

## 2011-09-21 DIAGNOSIS — M1710 Unilateral primary osteoarthritis, unspecified knee: Principal | ICD-10-CM | POA: Diagnosis present

## 2011-09-21 DIAGNOSIS — Z888 Allergy status to other drugs, medicaments and biological substances status: Secondary | ICD-10-CM

## 2011-09-21 DIAGNOSIS — Z4789 Encounter for other orthopedic aftercare: Secondary | ICD-10-CM

## 2011-09-21 DIAGNOSIS — G8929 Other chronic pain: Secondary | ICD-10-CM | POA: Diagnosis present

## 2011-09-21 DIAGNOSIS — Z882 Allergy status to sulfonamides status: Secondary | ICD-10-CM

## 2011-09-21 HISTORY — PX: TOTAL KNEE ARTHROPLASTY: SHX125

## 2011-09-21 SURGERY — ARTHROPLASTY, KNEE, TOTAL
Anesthesia: General | Site: Knee | Laterality: Left | Wound class: Clean

## 2011-09-21 MED ORDER — NEOSTIGMINE METHYLSULFATE 1 MG/ML IJ SOLN
INTRAMUSCULAR | Status: DC | PRN
  Administered 2011-09-21: 5 mg via INTRAVENOUS

## 2011-09-21 MED ORDER — MORPHINE SULFATE (PF) 1 MG/ML IV SOLN
INTRAVENOUS | Status: DC
  Administered 2011-09-21: 14:00:00 via INTRAVENOUS
  Administered 2011-09-21: 4.5 mg via INTRAVENOUS
  Filled 2011-09-21: qty 25

## 2011-09-21 MED ORDER — FENTANYL CITRATE 0.05 MG/ML IJ SOLN
50.0000 ug | INTRAMUSCULAR | Status: DC | PRN
  Administered 2011-09-21: 50 ug via INTRAVENOUS

## 2011-09-21 MED ORDER — ONDANSETRON HCL 4 MG/2ML IJ SOLN: 4.0000 mg | Freq: Four times a day (QID) | INTRAMUSCULAR | Status: DC | PRN

## 2011-09-21 MED ORDER — ONDANSETRON HCL 4 MG PO TABS: 4.0000 mg | ORAL_TABLET | Freq: Four times a day (QID) | ORAL | Status: DC | PRN

## 2011-09-21 MED ORDER — ACETAMINOPHEN 10 MG/ML IV SOLN
INTRAVENOUS | Status: DC | PRN
  Administered 2011-09-21: 1000 mg via INTRAVENOUS

## 2011-09-21 MED ORDER — OLMESARTAN MEDOXOMIL 40 MG PO TABS
40.0000 mg | ORAL_TABLET | Freq: Every day | ORAL | Status: DC
  Administered 2011-09-21 – 2011-09-22 (×2): 40 mg via ORAL
  Filled 2011-09-21 (×3): qty 1

## 2011-09-21 MED ORDER — ONDANSETRON HCL 4 MG/2ML IJ SOLN: 4.0000 mg | Freq: Once | INTRAMUSCULAR | Status: DC | PRN

## 2011-09-21 MED ORDER — FENTANYL CITRATE 0.05 MG/ML IJ SOLN
INTRAMUSCULAR | Status: DC | PRN
  Administered 2011-09-21 (×2): 50 ug via INTRAVENOUS
  Administered 2011-09-21: 200 ug via INTRAVENOUS

## 2011-09-21 MED ORDER — HYDROMORPHONE HCL PF 1 MG/ML IJ SOLN: 0.2500 mg | INTRAMUSCULAR | Status: DC | PRN

## 2011-09-21 MED ORDER — MORPHINE SULFATE (PF) 1 MG/ML IV SOLN
INTRAVENOUS | Status: DC
  Administered 2011-09-21: 15 mg via INTRAVENOUS
  Administered 2011-09-21: 12 mg via INTRAVENOUS
  Administered 2011-09-22: 3.7 mg via INTRAVENOUS
  Administered 2011-09-22: 10.5 mg via INTRAVENOUS
  Filled 2011-09-21: qty 25

## 2011-09-21 MED ORDER — VANCOMYCIN HCL IN DEXTROSE 1-5 GM/200ML-% IV SOLN
1000.0000 mg | Freq: Two times a day (BID) | INTRAVENOUS | Status: AC
  Administered 2011-09-21 – 2011-09-22 (×2): 1000 mg via INTRAVENOUS
  Filled 2011-09-21 (×2): qty 200

## 2011-09-21 MED ORDER — HYDROCHLOROTHIAZIDE 12.5 MG PO CAPS
12.5000 mg | ORAL_CAPSULE | Freq: Every day | ORAL | Status: DC
  Administered 2011-09-21 – 2011-09-22 (×2): 12.5 mg via ORAL
  Filled 2011-09-21 (×3): qty 1

## 2011-09-21 MED ORDER — METHOCARBAMOL 100 MG/ML IJ SOLN
500.0000 mg | Freq: Four times a day (QID) | INTRAVENOUS | Status: DC | PRN
  Filled 2011-09-21: qty 5

## 2011-09-21 MED ORDER — LIDOCAINE HCL (CARDIAC) 20 MG/ML IV SOLN
INTRAVENOUS | Status: DC | PRN
  Administered 2011-09-21: 100 mg via INTRAVENOUS

## 2011-09-21 MED ORDER — GLYCOPYRROLATE 0.2 MG/ML IJ SOLN
INTRAMUSCULAR | Status: DC | PRN
  Administered 2011-09-21: .8 mg via INTRAVENOUS

## 2011-09-21 MED ORDER — WARFARIN SODIUM 10 MG PO TABS
10.0000 mg | ORAL_TABLET | Freq: Once | ORAL | Status: AC
  Administered 2011-09-21: 10 mg via ORAL
  Filled 2011-09-21: qty 1

## 2011-09-21 MED ORDER — DIPHENHYDRAMINE HCL 12.5 MG/5ML PO ELIX
12.5000 mg | ORAL_SOLUTION | Freq: Four times a day (QID) | ORAL | Status: DC | PRN
  Administered 2011-09-21: 12.5 mg via ORAL
  Filled 2011-09-21: qty 5

## 2011-09-21 MED ORDER — BUPIVACAINE HCL (PF) 0.25 % IJ SOLN
INTRAMUSCULAR | Status: DC | PRN
  Administered 2011-09-21: 30 mL

## 2011-09-21 MED ORDER — BUPIVACAINE-EPINEPHRINE PF 0.5-1:200000 % IJ SOLN
INTRAMUSCULAR | Status: DC | PRN
  Administered 2011-09-21: 20 mL

## 2011-09-21 MED ORDER — FENTANYL CITRATE 0.05 MG/ML IJ SOLN
INTRAMUSCULAR | Status: AC
  Filled 2011-09-21: qty 2

## 2011-09-21 MED ORDER — PATIENT'S GUIDE TO USING COUMADIN BOOK
Freq: Once | Status: AC
  Administered 2011-09-21: 17:00:00
  Filled 2011-09-21: qty 1

## 2011-09-21 MED ORDER — ONDANSETRON HCL 4 MG/2ML IJ SOLN
INTRAMUSCULAR | Status: DC | PRN
  Administered 2011-09-21: 4 mg via INTRAVENOUS

## 2011-09-21 MED ORDER — ACETAMINOPHEN 325 MG PO TABS: 650.0000 mg | ORAL_TABLET | Freq: Four times a day (QID) | ORAL | Status: DC | PRN

## 2011-09-21 MED ORDER — ROCURONIUM BROMIDE 100 MG/10ML IV SOLN
INTRAVENOUS | Status: DC | PRN
  Administered 2011-09-21: 50 mg via INTRAVENOUS

## 2011-09-21 MED ORDER — POTASSIUM CHLORIDE IN NACL 20-0.9 MEQ/L-% IV SOLN
INTRAVENOUS | Status: DC
  Administered 2011-09-21: 17:00:00 via INTRAVENOUS
  Filled 2011-09-21 (×6): qty 1000

## 2011-09-21 MED ORDER — VECURONIUM BROMIDE 10 MG IV SOLR
INTRAVENOUS | Status: DC | PRN
  Administered 2011-09-21: 5 mg via INTRAVENOUS

## 2011-09-21 MED ORDER — SODIUM CHLORIDE 0.9 % IR SOLN
Status: DC | PRN
  Administered 2011-09-21: 1000 mL

## 2011-09-21 MED ORDER — MENTHOL 3 MG MT LOZG: 1.0000 | LOZENGE | OROMUCOSAL | Status: DC | PRN

## 2011-09-21 MED ORDER — MORPHINE SULFATE 4 MG/ML IJ SOLN
INTRAMUSCULAR | Status: DC | PRN
  Administered 2011-09-21: 4 mg via INTRAVENOUS

## 2011-09-21 MED ORDER — ACETAMINOPHEN 10 MG/ML IV SOLN
INTRAVENOUS | Status: AC
  Filled 2011-09-21: qty 100

## 2011-09-21 MED ORDER — NALOXONE HCL 0.4 MG/ML IJ SOLN: 0.4000 mg | INTRAMUSCULAR | Status: DC | PRN

## 2011-09-21 MED ORDER — DOCUSATE SODIUM 100 MG PO CAPS
100.0000 mg | ORAL_CAPSULE | Freq: Two times a day (BID) | ORAL | Status: DC
  Administered 2011-09-21 – 2011-09-23 (×4): 100 mg via ORAL
  Filled 2011-09-21 (×6): qty 1

## 2011-09-21 MED ORDER — MIDAZOLAM HCL 5 MG/5ML IJ SOLN
INTRAMUSCULAR | Status: DC | PRN
  Administered 2011-09-21: 1 mg via INTRAVENOUS

## 2011-09-21 MED ORDER — PHENOL 1.4 % MT LIQD
1.0000 | OROMUCOSAL | Status: DC | PRN
  Filled 2011-09-21: qty 177

## 2011-09-21 MED ORDER — SODIUM CHLORIDE 0.9 % IJ SOLN: 9.0000 mL | INTRAMUSCULAR | Status: DC | PRN

## 2011-09-21 MED ORDER — WARFARIN VIDEO: Freq: Once | Status: DC

## 2011-09-21 MED ORDER — ACETAMINOPHEN 650 MG RE SUPP: 650.0000 mg | Freq: Four times a day (QID) | RECTAL | Status: DC | PRN

## 2011-09-21 MED ORDER — PROPOFOL 10 MG/ML IV EMUL
INTRAVENOUS | Status: DC | PRN
  Administered 2011-09-21: 200 mg via INTRAVENOUS

## 2011-09-21 MED ORDER — VALSARTAN-HYDROCHLOROTHIAZIDE 320-12.5 MG PO TABS: 1.0000 | ORAL_TABLET | Freq: Every day | ORAL | Status: DC

## 2011-09-21 MED ORDER — FLEET ENEMA 7-19 GM/118ML RE ENEM: 1.0000 | ENEMA | Freq: Once | RECTAL | Status: AC | PRN

## 2011-09-21 MED ORDER — LACTATED RINGERS IV SOLN
INTRAVENOUS | Status: DC | PRN
  Administered 2011-09-21 (×2): via INTRAVENOUS

## 2011-09-21 MED ORDER — DIPHENHYDRAMINE HCL 50 MG/ML IJ SOLN
12.5000 mg | Freq: Four times a day (QID) | INTRAMUSCULAR | Status: DC | PRN
  Administered 2011-09-21 – 2011-09-22 (×2): 12.5 mg via INTRAVENOUS
  Filled 2011-09-21 (×2): qty 1

## 2011-09-21 MED ORDER — PANTOPRAZOLE SODIUM 40 MG PO TBEC
40.0000 mg | DELAYED_RELEASE_TABLET | Freq: Every day | ORAL | Status: DC
  Administered 2011-09-21 – 2011-09-23 (×3): 40 mg via ORAL
  Filled 2011-09-21 (×3): qty 1

## 2011-09-21 MED ORDER — METHOCARBAMOL 500 MG PO TABS
500.0000 mg | ORAL_TABLET | Freq: Four times a day (QID) | ORAL | Status: DC | PRN
  Administered 2011-09-22 (×3): 500 mg via ORAL
  Filled 2011-09-21 (×4): qty 1

## 2011-09-21 MED ORDER — METHOCARBAMOL 100 MG/ML IJ SOLN
500.0000 mg | INTRAVENOUS | Status: AC
  Administered 2011-09-21: 500 mg via INTRAVENOUS
  Filled 2011-09-21: qty 5

## 2011-09-21 MED ORDER — ENOXAPARIN SODIUM 30 MG/0.3ML ~~LOC~~ SOLN
30.0000 mg | Freq: Two times a day (BID) | SUBCUTANEOUS | Status: DC
  Administered 2011-09-22 (×2): 30 mg via SUBCUTANEOUS
  Filled 2011-09-21 (×5): qty 0.3

## 2011-09-21 SURGICAL SUPPLY — 55 items
BANDAGE ESMARK 6X9 LF (GAUZE/BANDAGES/DRESSINGS) ×1 IMPLANT
BLADE SAG 18X100X1.27 (BLADE) ×4 IMPLANT
BNDG ESMARK 6X9 LF (GAUZE/BANDAGES/DRESSINGS) ×2
BOOTCOVER CLEANROOM LRG (PROTECTIVE WEAR) ×4 IMPLANT
BOWL SMART MIX CTS (DISPOSABLE) ×2 IMPLANT
CEMENT BONE SIMPLEX SPEEDSET (Cement) ×4 IMPLANT
CLOTH BEACON ORANGE TIMEOUT ST (SAFETY) ×2 IMPLANT
COVER BACK TABLE 24X17X13 BIG (DRAPES) ×2 IMPLANT
COVER SURGICAL LIGHT HANDLE (MISCELLANEOUS) ×2 IMPLANT
CUFF TOURNIQUET SINGLE 34IN LL (TOURNIQUET CUFF) ×2 IMPLANT
DRAPE EXTREMITY T 121X128X90 (DRAPE) ×2 IMPLANT
DRAPE PROXIMA HALF (DRAPES) ×2 IMPLANT
DRAPE U-SHAPE 47X51 STRL (DRAPES) ×2 IMPLANT
DRSG PAD ABDOMINAL 8X10 ST (GAUZE/BANDAGES/DRESSINGS) ×2 IMPLANT
DURAPREP 26ML APPLICATOR (WOUND CARE) ×2 IMPLANT
ELECT CAUTERY BLADE 6.4 (BLADE) ×2 IMPLANT
ELECT REM PT RETURN 9FT ADLT (ELECTROSURGICAL) ×2
ELECTRODE REM PT RTRN 9FT ADLT (ELECTROSURGICAL) ×1 IMPLANT
EVACUATOR 1/8 PVC DRAIN (DRAIN) ×2 IMPLANT
FACESHIELD LNG OPTICON STERILE (SAFETY) ×2 IMPLANT
GAUZE SPONGE 4X4 12PLY STRL LF (GAUZE/BANDAGES/DRESSINGS) ×2 IMPLANT
GAUZE XEROFORM 5X9 LF (GAUZE/BANDAGES/DRESSINGS) ×2 IMPLANT
GLOVE BIOGEL PI IND STRL 8 (GLOVE) ×1 IMPLANT
GLOVE BIOGEL PI INDICATOR 8 (GLOVE) ×1
GOWN PREVENTION PLUS XLARGE (GOWN DISPOSABLE) ×4 IMPLANT
GOWN STRL NON-REIN LRG LVL3 (GOWN DISPOSABLE) ×4 IMPLANT
GOWN STRL REIN 2XL LVL4 (GOWN DISPOSABLE) ×2 IMPLANT
HANDPIECE INTERPULSE COAX TIP (DISPOSABLE) ×1
IMMOBILIZER KNEE 22 UNIV (SOFTGOODS) ×2 IMPLANT
IMMOBILIZER KNEE 24 THIGH 36 (MISCELLANEOUS) IMPLANT
IMMOBILIZER KNEE 24 UNIV (MISCELLANEOUS)
KIT BASIN OR (CUSTOM PROCEDURE TRAY) ×2 IMPLANT
KIT ROOM TURNOVER OR (KITS) ×2 IMPLANT
MANIFOLD NEPTUNE II (INSTRUMENTS) ×2 IMPLANT
NS IRRIG 1000ML POUR BTL (IV SOLUTION) ×2 IMPLANT
PACK TOTAL JOINT (CUSTOM PROCEDURE TRAY) ×2 IMPLANT
PAD ARMBOARD 7.5X6 YLW CONV (MISCELLANEOUS) ×4 IMPLANT
PAD CAST 4YDX4 CTTN HI CHSV (CAST SUPPLIES) ×1 IMPLANT
PADDING CAST COTTON 4X4 STRL (CAST SUPPLIES) ×1
PADDING CAST COTTON 6X4 STRL (CAST SUPPLIES) ×2 IMPLANT
RUBBERBAND STERILE (MISCELLANEOUS) ×2 IMPLANT
SET HNDPC FAN SPRY TIP SCT (DISPOSABLE) ×1 IMPLANT
SPONGE GAUZE 4X4 12PLY (GAUZE/BANDAGES/DRESSINGS) ×2 IMPLANT
STAPLER VISISTAT 35W (STAPLE) ×2 IMPLANT
SUCTION FRAZIER TIP 10 FR DISP (SUCTIONS) ×2 IMPLANT
SUT VIC AB 1 CTX 36 (SUTURE) ×2
SUT VIC AB 1 CTX36XBRD ANBCTR (SUTURE) ×2 IMPLANT
SUT VIC AB 2-0 CT1 27 (SUTURE) ×2
SUT VIC AB 2-0 CT1 TAPERPNT 27 (SUTURE) ×2 IMPLANT
SYR 30ML LL (SYRINGE) ×2 IMPLANT
SYR 30ML SLIP (SYRINGE) ×2 IMPLANT
TOWEL OR 17X24 6PK STRL BLUE (TOWEL DISPOSABLE) ×2 IMPLANT
TOWEL OR 17X26 10 PK STRL BLUE (TOWEL DISPOSABLE) ×2 IMPLANT
TRAY FOLEY CATH 14FR (SET/KITS/TRAYS/PACK) ×2 IMPLANT
WATER STERILE IRR 1000ML POUR (IV SOLUTION) ×6 IMPLANT

## 2011-09-21 NOTE — Anesthesia Procedure Notes (Signed)
Anesthesia Regional Block:  Femoral nerve block  Pre-Anesthetic Checklist: ,, timeout performed, Correct Patient, Correct Site, Correct Laterality, Correct Procedure, Correct Position, site marked, Risks and benefits discussed,  Surgical consent,  Pre-op evaluation,  At surgeon's request and post-op pain management  Laterality: Left  Prep: Maximum Sterile Barrier Precautions used and chloraprep       Needles:  Injection technique: Single-shot  Needle Type: Stimulator Needle - 80        Needle insertion depth: 9 cm   Additional Needles:  Procedures: nerve stimulator Femoral nerve block  Nerve Stimulator or Paresthesia:  Response: 0.5 mA, 0.1 ms, 9 cm  Additional Responses:   Narrative:  Start time: 09/21/2011 9:48 AM End time: 09/21/2011 9:52 AM Injection made incrementally with aspirations every 5 mL.  Performed by: Personally  Anesthesiologist: Maren Beach MD  Additional Notes: 20cc 0.5% Marcaine w/o difficulty or discomfort  GES

## 2011-09-21 NOTE — Anesthesia Postprocedure Evaluation (Signed)
  Anesthesia Post-op Note  Patient: Martha Chambers  Procedure(s) Performed:  TOTAL KNEE ARTHROPLASTY - 120 MINUTES FOR THIS SURGERY  Patient Location: PACU  Anesthesia Type: GA combined with regional for post-op pain  Level of Consciousness: awake, oriented, sedated and patient cooperative  Airway and Oxygen Therapy: Patient Spontanous Breathing and Patient connected to nasal cannula oxygen  Post-op Pain: mild  Post-op Assessment: Post-op Vital signs reviewed, Patient's Cardiovascular Status Stable, Respiratory Function Stable, Patent Airway, No signs of Nausea or vomiting and Pain level controlled  Post-op Vital Signs: stable  Complications: No apparent anesthesia complications

## 2011-09-21 NOTE — Progress Notes (Signed)
ANTICOAGULATION CONSULT NOTE - Initial Consult  Pharmacy Consult for coumadin Indication: TKA  Allergies  Allergen Reactions  . Ceftin   . Ciprofloxacin   . Codeine   . Strawberry   . Sulfa Antibiotics   . Tomato     Patient Measurements: Height: 5\' 6"  (167.6 cm) Weight: 255 lb (115.667 kg) IBW/kg (Calculated) : 59.3    Vital Signs: Temp: 98.8 F (37.1 C) (01/02 1547) Temp src: Oral (01/02 0805) BP: 135/68 mmHg (01/02 1547) Pulse Rate: 83  (01/02 1547)  Labs: No results found for this basename: HGB:2,HCT:3,PLT:3,APTT:3,LABPROT:3,INR:3,HEPARINUNFRC:3,CREATININE:3,CKTOTAL:3,CKMB:3,TROPONINI:3 in the last 72 hours Estimated Creatinine Clearance: 94.3 ml/min (by C-G formula based on Cr of 0.59).  Medical History: Past Medical History  Diagnosis Date  . Hypertension   . GERD (gastroesophageal reflux disease)   . Arthritis     Medications:  Prescriptions prior to admission  Medication Sig Dispense Refill  . aspirin EC 81 MG tablet Take 81 mg by mouth daily.        . naproxen (NAPROSYN) 500 MG tablet Take 500 mg by mouth 2 (two) times daily as needed. For pain       . omeprazole (PRILOSEC) 20 MG capsule Take 20 mg by mouth daily.        . traMADol (ULTRAM) 50 MG tablet Take 50 mg by mouth every 6 (six) hours as needed. For pain, Maximum dose= 8 tablets per day       . valsartan-hydrochlorothiazide (DIOVAN-HCT) 320-12.5 MG per tablet Take 1 tablet by mouth daily.         Scheduled:    . docusate sodium  100 mg Oral BID  . enoxaparin  30 mg Subcutaneous Q12H  . methocarbamol(ROBAXIN) IV  500 mg Intravenous To PACU  . morphine   Intravenous Q4H  . morphine   Intravenous To PACU  . pantoprazole  40 mg Oral Q1200  . valsartan-hydrochlorothiazide  1 tablet Oral Daily  . vancomycin  1,500 mg Intravenous 60 min Pre-Op  . vancomycin  1,000 mg Intravenous Q12H    Assessment: 61 yo who is s/p TKR. Coumadin will be started for DVT prophylaxis. Her preop admission INR =  1.08.  Goal of Therapy:  INR 2-3   Plan:  1. Coumadin 10mg  x1 2. Daily PT/INR  [facility]ses [facility] 09/21/2011,4:07 PM

## 2011-09-21 NOTE — Brief Op Note (Signed)
09/21/2011  12:16 PM  PATIENT:  Martha Chambers  61 y.o. female  PRE-OPERATIVE DIAGNOSIS:  DJD LEFT KNEE  POST-OPERATIVE DIAGNOSIS:  DJD LEFT KNEE  PROCEDURE:  Procedure(s): Left TOTAL KNEE ARTHROPLASTY  SURGEON:  Surgeon(s): Loreta Ave, MD  PHYSICIAN ASSISTANT: Zonia Kief M   ANESTHESIA:   regional and general  EBL:  Total I/O In: 1000 [I.V.:1000] Out: 300 [Urine:300]   DRAINS: hemovac   SPECIMEN:  No Specimen  DISPOSITION OF SPECIMEN:  N/A  COUNTS:  YES  TOURNIQUET:   Total Tourniquet Time Documented: Thigh (Left) - 71 minutes   PATIENT DISPOSITION:  PACU - hemodynamically stable.

## 2011-09-21 NOTE — Interval H&P Note (Signed)
History and Physical Interval Note:  09/21/2011 8:13 AM  Martha Chambers  has presented today for surgery, with the diagnosis of DJD LEFT KNEE  The various methods of treatment have been discussed with the patient and family. After consideration of risks, benefits and other options for treatment, the patient has consented to  Procedure(s): TOTAL KNEE ARTHROPLASTY as a surgical intervention .  The patients' history has been reviewed, patient examined, no change in status, stable for surgery.  I have reviewed the patients' chart and labs.  Questions were answered to the patient's satisfaction.     MURPHY,DANIEL F

## 2011-09-21 NOTE — Preoperative (Signed)
Beta Blockers   Reason not to administer Beta Blockers:Not Applicable, not currently taking beta blockers

## 2011-09-21 NOTE — Transfer of Care (Signed)
Immediate Anesthesia Transfer of Care Note  Patient: Martha Chambers  Procedure(s) Performed:  TOTAL KNEE ARTHROPLASTY - 120 MINUTES FOR THIS SURGERY  Patient Location: PACU  Anesthesia Type: General  Level of Consciousness: awake, oriented and patient cooperative  Airway & Oxygen Therapy: Patient Spontanous Breathing and Patient connected to nasal cannula oxygen  Post-op Assessment: Report given to PACU RN and Post -op Vital signs reviewed and stable  Post vital signs: Reviewed  Complications: No apparent anesthesia complications

## 2011-09-21 NOTE — Plan of Care (Signed)
Problem: Consults Goal: Diagnosis- Total Joint Replacement Primary Total Knee     

## 2011-09-21 NOTE — Anesthesia Preprocedure Evaluation (Signed)
Anesthesia Evaluation  Patient identified by MRN, date of birth, ID band Patient awake    Reviewed: Allergy & Precautions, H&P , NPO status   History of Anesthesia Complications (+) AWARENESS UNDER ANESTHESIA  Airway Mallampati: I TM Distance: >3 FB Neck ROM: full    Dental  (+) Teeth Intact and Chipped   Pulmonary neg pulmonary ROS,    Pulmonary exam normal       Cardiovascular hypertension, regular Normal    Neuro/Psych Negative Neurological ROS  Negative Psych ROS   GI/Hepatic Neg liver ROS,   Endo/Other  Negative Endocrine ROS  Renal/GU negative Renal ROS  Genitourinary negative   Musculoskeletal   Abdominal Normal abdominal exam  (+)   Peds  Hematology negative hematology ROS (+)   Anesthesia Other Findings   Reproductive/Obstetrics                           Anesthesia Physical Anesthesia Plan  ASA: II  Anesthesia Plan: General ETT   Post-op Pain Management: MAC Combined w/ Regional for Post-op pain   Induction: Intravenous  Airway Management Planned: Oral ETT  Additional Equipment:   Intra-op Plan:   Post-operative Plan: Extubation in OR  Informed Consent: I have reviewed the patients History and Physical, chart, labs and discussed the procedure including the risks, benefits and alternatives for the proposed anesthesia with the patient or authorized representative who has indicated his/her understanding and acceptance.     Plan Discussed with: Anesthesiologist, CRNA and Surgeon  Anesthesia Plan Comments:         Anesthesia Quick Evaluation

## 2011-09-22 ENCOUNTER — Encounter (HOSPITAL_COMMUNITY): Payer: Self-pay | Admitting: Orthopedic Surgery

## 2011-09-22 LAB — PROTIME-INR: INR: 1.26 (ref 0.00–1.49)

## 2011-09-22 LAB — CBC
HCT: 31.8 % — ABNORMAL LOW (ref 36.0–46.0)
Platelets: 285 10*3/uL (ref 150–400)
RDW: 13.7 % (ref 11.5–15.5)
WBC: 6.7 10*3/uL (ref 4.0–10.5)

## 2011-09-22 LAB — BASIC METABOLIC PANEL
BUN: 7 mg/dL (ref 6–23)
Chloride: 97 mEq/L (ref 96–112)
GFR calc Af Amer: >90 mL/min (ref >90)
Potassium: 3.6 mEq/L (ref 3.5–5.1)
Sodium: 134 mEq/L — ABNORMAL LOW (ref 135–145)

## 2011-09-22 MED ORDER — HYDROMORPHONE HCL PF 1 MG/ML IJ SOLN
1.0000 mg | Freq: Once | INTRAMUSCULAR | Status: AC
  Administered 2011-09-22: 1 mg via INTRAVENOUS
  Filled 2011-09-22: qty 1

## 2011-09-22 MED ORDER — SODIUM CHLORIDE 0.9 % IV SOLN: 1.0000 mg/h | Freq: Once | INTRAVENOUS | Status: DC

## 2011-09-22 MED ORDER — WARFARIN SODIUM 10 MG PO TABS
10.0000 mg | ORAL_TABLET | Freq: Once | ORAL | Status: AC
  Administered 2011-09-22: 10 mg via ORAL
  Filled 2011-09-22: qty 1

## 2011-09-22 MED ORDER — HYDROCODONE-ACETAMINOPHEN 10-325 MG PO TABS
1.0000 | ORAL_TABLET | ORAL | Status: DC | PRN
  Administered 2011-09-22: 2 via ORAL
  Administered 2011-09-22: 1 via ORAL
  Administered 2011-09-22: 2 via ORAL
  Administered 2011-09-22: 1 via ORAL
  Administered 2011-09-23 (×2): 2 via ORAL
  Filled 2011-09-22 (×4): qty 2
  Filled 2011-09-22 (×2): qty 1
  Filled 2011-09-22: qty 2

## 2011-09-22 NOTE — Progress Notes (Signed)
ANTICOAGULATION CONSULT NOTE - Follow Up Consult  Pharmacy Consult for Coumadin Indication: VTE prophylaxis  Allergies  Allergen Reactions  . Ceftin   . Ciprofloxacin   . Codeine   . Strawberry   . Sulfa Antibiotics   . Tomato     Patient Measurements: Height: 5\' 6"  (167.6 cm) Weight: 255 lb (115.667 kg) IBW/kg (Calculated) : 59.3   Vital Signs: Temp: 99.2 F (37.3 C) (01/03 0545) Temp src: Oral (01/03 0545) BP: 111/73 mmHg (01/03 0545) Pulse Rate: 86  (01/03 0545)  Labs:  Basename 09/22/11 0525  HGB 10.3*  HCT 31.8*  PLT 285  APTT --  LABPROT 16.1*  INR 1.26  HEPARINUNFRC --  CREATININE 0.58  CKTOTAL --  CKMB --  TROPONINI --   Estimated Creatinine Clearance: 94.3 ml/min (by C-G formula based on Cr of 0.58).   Medications:  Scheduled:    . docusate sodium  100 mg Oral BID  . enoxaparin  30 mg Subcutaneous Q12H  . hydrochlorothiazide  12.5 mg Oral Daily  .  HYDROmorphone (DILAUDID) injection  1 mg Intravenous Once  . methocarbamol(ROBAXIN) IV  500 mg Intravenous To PACU  . olmesartan  40 mg Oral Daily  . pantoprazole  40 mg Oral Q1200  . patient's guide to using coumadin book   Does not apply Once  . vancomycin  1,500 mg Intravenous 60 min Pre-Op  . vancomycin  1,000 mg Intravenous Q12H  . warfarin  10 mg Oral ONCE-1800  . warfarin   Does not apply Once  . DISCONTD: HYDROmorphone  1 mg/hr Intravenous Once  . DISCONTD: morphine   Intravenous Q4H  . DISCONTD: morphine   Intravenous To PACU  . DISCONTD: valsartan-hydrochlorothiazide  1 tablet Oral Daily    Assessment: 61 y/o female patient s/p TKR requiring coumadin for dvt px. Coumadin started yesterday, no bleeding reported.  Goal of Therapy:  INR 2-3   Plan:  Repeat coumadin 10mg  today, continue lovenox and follow daily protime.  Verlene Mayer, PharmD, BCPS Pager [phone] 09/22/2011,10:33 AM

## 2011-09-22 NOTE — Progress Notes (Signed)
Utilization review completed. Female Minish, RN, BSN. 

## 2011-09-22 NOTE — Progress Notes (Signed)
Physical Therapy Evaluation Patient Details Name: Martha Chambers MRN: [id number] DOB: Jan 31, 1951 Today's Date: 09/22/2011  Problem List: There is no problem list on file for this patient.   Past Medical History:  Past Medical History  Diagnosis Date  . Hypertension   . GERD (gastroesophageal reflux disease)   . Arthritis    Past Surgical History:  Past Surgical History  Procedure Date  . Cholecystectomy   . Tonsillectomy   . Knee arthroplasty 07    rt  . Abdominal hysterectomy     PT Assessment/Plan/Recommendation PT Assessment Clinical Impression Statement: Pt presents with a medical diagnosis of Left TKA along with the following impairments/deficits and therapy diagnosis listed below. Pt will benefit from skilled PT in the acute care setting in order to maximize functional mobility for a safe d/c home PT Recommendation/Assessment: Patient will need skilled PT in the acute care venue PT Problem List: Decreased strength;Decreased range of motion;Decreased activity tolerance;Decreased mobility;Decreased knowledge of use of DME;Decreased knowledge of precautions;Pain PT Therapy Diagnosis : Difficulty walking;Acute pain PT Plan PT Frequency: 7X/week PT Treatment/Interventions: DME instruction;Gait training;Stair training;Functional mobility training;Therapeutic activities;Therapeutic exercise;Patient/family education PT Recommendation Follow Up Recommendations: Home health PT;24 hour supervision/assistance Equipment Recommended: None recommended by PT PT Goals  Acute Rehab PT Goals PT Goal Formulation: With patient Time For Goal Achievement: 7 days Pt will go Supine/Side to Sit: with modified independence PT Goal: Supine/Side to Sit - Progress: Progressing toward goal Pt will go Sit to Supine/Side: with modified independence PT Goal: Sit to Supine/Side - Progress: Progressing toward goal Pt will go Sit to Stand: with modified independence PT Goal: Sit to Stand - Progress:  Progressing toward goal Pt will go Stand to Sit: with modified independence PT Goal: Stand to Sit - Progress: Progressing toward goal Pt will Transfer Bed to Chair/Chair to Bed: with supervision PT Transfer Goal: Bed to Chair/Chair to Bed - Progress: Progressing toward goal Pt will Ambulate: >150 feet;with supervision;with rolling walker PT Goal: Ambulate - Progress: Progressing toward goal Pt will Go Up / Down Stairs: 6-9 stairs;with supervision;with rail(s) PT Goal: Up/Down Stairs - Progress: Not met Pt will Perform Home Exercise Program: Independently PT Goal: Perform Home Exercise Program - Progress: Progressing toward goal  PT Evaluation Precautions/Restrictions  Restrictions Weight Bearing Restrictions: Yes LLE Weight Bearing: Weight bearing as tolerated Prior Functioning  Home Living Lives With: Spouse Receives Help From: Family Type of Home: House Home Layout: Two level Alternate Level Stairs-Rails: Right Alternate Level Stairs-Number of Steps: 8 Home Access: Stairs to enter Entrance Stairs-Rails: Can reach both Entrance Stairs-Number of Steps: 8 Bathroom Shower/Tub: Engineer, manufacturing systems: Standard Bathroom Accessibility: Yes How Accessible: Accessible via walker Home Adaptive Equipment: Walker - rolling;Bedside commode/3-in-1;Shower chair with back Prior Function Level of Independence: Independent with basic ADLs;Independent with gait;Independent with transfers Able to Take Stairs?: Yes Driving: Yes Vocation: Full time employment Cognition Cognition Arousal/Alertness: Awake/alert Orientation Level: Oriented X4 Sensation/Coordination Sensation Light Touch: Appears Intact Extremity Assessment RLE Assessment RLE Assessment: Within Functional Limits LLE Assessment LLE Assessment: Exceptions to WFL LLE AROM (degrees) Overall AROM Left Lower Extremity: Deficits;Due to pain (Hip and Ankle WFL) LLE Strength LLE Overall Strength: Deficits;Due to pain (Hip  and Ankle WFL; Pt able to complete SLR with min assist) Mobility (including Balance) Bed Mobility Bed Mobility: Yes Supine to Sit: 4: Min assist;HOB elevated (Comment degrees);With rails (40) Supine to Sit Details (indicate cue type and reason): VC for hand placement and technique Sitting - Scoot to Edge of Bed:  6: Modified independent (Device/Increase time) Transfers Transfers: Yes Sit to Stand: 3: Mod assist;With upper extremity assist;From bed Sit to Stand Details (indicate cue type and reason): VC for hand placement for safety. Assist with stability into standing Stand to Sit: 3: Mod assist;With upper extremity assist;To chair/3-in-1 Stand to Sit Details: VC for hand placement. Pt with uncontrolled descent into chair Ambulation/Gait Ambulation/Gait: Yes Ambulation/Gait Assistance: 4: Min assist Ambulation/Gait Assistance Details (indicate cue type and reason): VC for proper gait sequencing and distance to RW for safety. Pt hesitant to bear weight on LLE Ambulation Distance (Feet): 25 Feet Assistive device: Rolling walker Gait Pattern: Decreased step length - right;Decreased stance time - left;Decreased hip/knee flexion - left;Decreased weight shift to left;Trunk flexed Gait velocity: Decreased gait speed Stairs: No    Exercise  Total Joint Exercises Ankle Circles/Pumps: AROM;Strengthening;Both;10 reps;Supine Quad Sets: AROM;Strengthening;Left;10 reps;Supine End of Session PT - End of Session Equipment Utilized During Treatment: Gait belt Activity Tolerance: Patient tolerated treatment well Patient left: in chair;with call bell in reach Nurse Communication: Mobility status for transfers;Mobility status for ambulation General Behavior During Session: [facility] for tasks performed Cognition: [facility] for tasks performed  Milana Kidney 09/22/2011, 9:22 AM  09/22/2011 Milana Kidney DPT PAGER: [phone] OFFICE: [phone]

## 2011-09-22 NOTE — Progress Notes (Signed)
Occupational Therapy Evaluation Patient Details Name: Martha Chambers MRN: [id number] DOB: 1950-10-26 Today's Date: 09/22/2011  Problem List: There is no problem list on file for this patient.   Past Medical History:  Past Medical History  Diagnosis Date  . Hypertension   . GERD (gastroesophageal reflux disease)   . Arthritis    Past Surgical History:  Past Surgical History  Procedure Date  . Cholecystectomy   . Tonsillectomy   . Knee arthroplasty 07    rt  . Abdominal hysterectomy   . Total knee arthroplasty 09/21/2011    Procedure: TOTAL KNEE ARTHROPLASTY;  Surgeon: Loreta Ave, MD;  Location: [facility] OR;  Service: Orthopedics;  Laterality: Left;  120 MINUTES FOR THIS SURGERY    OT Assessment/Plan/Recommendation OT Assessment Clinical Impression Statement: Pt s/p L TKA and demonstrates with decreased I with ADLs and functional transfers.  Will benefit from acute OT to increase I with ADLs/functional transfers in prep for safe d/c home with husband. OT Recommendation/Assessment: Patient will need skilled OT in the acute care venue OT Problem List: Decreased knowledge of use of DME or AE;Pain;Decreased activity tolerance OT Therapy Diagnosis : Acute pain OT Plan OT Frequency: Min 2X/week OT Treatment/Interventions: Self-care/ADL training;DME and/or AE instruction;Therapeutic activities;Patient/family education OT Recommendation Follow Up Recommendations: 24 hour supervision/assistance Equipment Recommended: None recommended by OT Individuals Consulted Consulted and Agree with Results and Recommendations: Patient OT Goals Acute Rehab OT Goals OT Goal Formulation: With patient Time For Goal Achievement: 7 days ADL Goals Pt Will Perform Grooming: with modified independence;Standing at sink ADL Goal: Grooming - Progress: Not met Pt Will Perform Lower Body Bathing: with modified independence;Sit to stand from chair;Sit to stand from bed ADL Goal: Lower Body Bathing - Progress:  Not met Pt Will Perform Lower Body Dressing: with modified independence;Sit to stand from chair;Sit to stand from bed ADL Goal: Lower Body Dressing - Progress: Not met Pt Will Transfer to Toilet: with modified independence;Ambulation;with DME;3-in-1 ADL Goal: Toilet Transfer - Progress: Not met Pt Will Perform Tub/Shower Transfer: Tub transfer;with modified independence;with DME;Shower seat with back;Ambulation ADL Goal: Tub/Shower Transfer - Progress: Not met  OT Evaluation Precautions/Restrictions  Restrictions Weight Bearing Restrictions: No LLE Weight Bearing: Weight bearing as tolerated Prior Functioning Home Living Lives With: Spouse Receives Help From: Family Type of Home: House Home Layout: Two level Alternate Level Stairs-Rails: Right Alternate Level Stairs-Number of Steps: 8 Home Access: Stairs to enter Entrance Stairs-Rails: Can reach both Entrance Stairs-Number of Steps: 8 Bathroom Shower/Tub: Engineer, manufacturing systems: Standard Bathroom Accessibility: Yes How Accessible: Accessible via walker Home Adaptive Equipment: Walker - rolling;Bedside commode/3-in-1;Shower chair with back Prior Function Level of Independence: Independent with basic ADLs;Independent with gait;Independent with transfers Able to Take Stairs?: Yes Driving: Yes Vocation: Full time employment ADL ADL Grooming: Simulated;Set up Where Assessed - Grooming: Sitting, chair Upper Body Bathing: Simulated;Set up Upper Body Bathing Details (indicate cue type and reason): setup to gather bathing materials Where Assessed - Upper Body Bathing: Sitting, chair Lower Body Bathing: Simulated;Minimal assistance Lower Body Bathing Details (indicate cue type and reason): Min assist to reach L lower leg and foot Where Assessed - Lower Body Bathing: Sit to stand from chair Upper Body Dressing: Simulated;Set up Upper Body Dressing Details (indicate cue type and reason): setup to gather dressing items Where  Assessed - Upper Body Dressing: Sitting, chair Lower Body Dressing: Simulated;Minimal assistance Lower Body Dressing Details (indicate cue type and reason): Min assist for threading LLE through clothing Where Assessed - Lower  Body Dressing: Sit to stand from chair Toilet Transfer: Performed;Minimal assistance Toilet Transfer Details (indicate cue type and reason): min assist for steadying and balance Toilet Transfer Method: Ambulating Toilet Transfer Equipment: Raised toilet seat with arms (or 3-in-1 over toilet) Toileting - Clothing Manipulation: Performed;Minimal assistance Toileting - Clothing Manipulation Details (indicate cue type and reason): assist to pull night gown up in standing position Where Assessed - Toileting Clothing Manipulation: Standing Toileting - Hygiene: Performed;Supervision/safety Where Assessed - Toileting Hygiene: Standing Vision/Perception    Cognition Cognition Arousal/Alertness: Awake/alert Overall Cognitive Status: Appears within functional limits for tasks assessed Orientation Level: Oriented X4 Sensation/Coordination   Extremity Assessment RUE Assessment RUE Assessment: Within Functional Limits LUE Assessment LUE Assessment: Within Functional Limits Mobility  Bed Mobility Bed Mobility: No Transfers Transfers: Yes Sit to Stand: 3: Mod assist;With upper extremity assist;From chair/3-in-1;With armrests Sit to Stand Details (indicate cue type and reason): VC for hand placement. Assist for steadying and facilitating lift off into standing position. Stand to Sit: 4: Min assist;With upper extremity assist;To chair/3-in-1;With armrests Stand to Sit Details: Min assist for controlled descent Exercises   End of Session OT - End of Session Equipment Utilized During Treatment: Gait belt;Left knee immobilizer Activity Tolerance: Patient tolerated treatment well Patient left: in chair;with call bell in reach;with family/visitor present General Behavior  During Session: [facility] for tasks performed Cognition: [facility] for tasks performed   Cipriano Mile 09/22/2011, 12:19 PM  09/22/2011 Cipriano Mile OTR/L Pager [phone] Office [phone]

## 2011-09-22 NOTE — Progress Notes (Signed)
Subjective: Doing ok. C/o itching from morphine pca.  Denies chest pain, sob.   Objective: Vital signs in last 24 hours: Temp:  [97.9 F (36.6 C)-99.2 F (37.3 C)] 99.2 F (37.3 C) (01/03 0545) Pulse Rate:  [70-89] 86  (01/03 0545) Resp:  [10-41] 18  (01/03 0545) BP: (111-155)/(62-82) 111/73 mmHg (01/03 0545) SpO2:  [96 %-100 %] 99 % (01/03 0545) Weight:  [115.667 kg (255 lb)] 255 lb (115.667 kg) (01/02 1339)  Intake/Output from previous day: 01/02 0701 - 01/03 0700 In: 1400 [I.V.:1400] Out: 2925 [Urine:2900; Blood:25] Intake/Output this shift:     Basename 09/22/11 0525  HGB 10.3*    Basename 09/22/11 0525  WBC 6.7  RBC 3.50*  HCT 31.8*  PLT 285    Basename 09/22/11 0525  NA 134*  K 3.6  CL 97  CO2 29  BUN 7  CREATININE 0.58  GLUCOSE 101*  CALCIUM 8.8    Basename 09/22/11 0525  LABPT --  INR 1.26    Neurovascular intact Compartment soft Some bleeding thru dressing.   Assessment/Plan: possible d/c home fri or sat if doing well with PT.  D/c pca. Encouraged incentive spirometry.     OWENS,JAMES M 09/22/2011, 7:29 AM

## 2011-09-22 NOTE — Op Note (Signed)
NAMEMarland Kitchen  APARNA, VANDERWEELE NO.:  [id number]  MEDICAL RECORD NO.:  [id number]  LOCATION:  5015                         FACILITY:  [facility]  PHYSICIAN:  Loreta Ave, M.D. 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  PROCEDURE:  Modified minimally invasive left total knee replacement Stryker triathlon prosthesis.  Soft tissue balancing.  Cemented pegged posterior stabilized #4 femoral component.  Cemented #4 tibial component, 11-mm polyethylene insert.  Cemented resurfacing 35-mm patellar component.  SURGEON:  Loreta Ave, M.D.  ASSISTANT:  Genene Churn. Barry Dienes, PA-C, present throughout the entire case necessary for timely completion of procedure.  ANESTHESIA:  General.  BLOOD LOSS:  Minimal.  CULTURES:  None.  COMPLICATION:  None.  DRESSINGS:  Soft compressive with knee immobilizer.  TOURNIQUET TIME:  1 hour.  DRAINS:  Hemovac x1.  PROCEDURE:  The patient was brought to the operating room, placed on the operating table in supine position.  After adequate anesthesia had been obtained, tourniquet applied on her thigh.  Prepped and draped in usual sterile fashion.  Positioning in the entire procedure made more difficult because of her obesity.  Exsanguinated with elevation of Esmarch.  Tourniquet inflated to 400 mmHg.  Straight incision above the patella down to tibial tubercle.  Medial arthrotomy, vastus splitting, preserving quad tendon.  Grade 4 change throughout.  Remnants of menisci, cruciate ligaments, loose body and spurs removed.  Medial capsule release done.  Distal femur exposed.  Intramedullary guide placed.  A 10-mm resection, 5 degrees of valgus.  Using  epicondylar axis and measuring in that plane, the femur was sized, cut, and fitted for posterior stabilized pegged #4 component.  Proximal tibial resection, 0 degree cut extramedullary guide.  Size #4 component.  Patella exposed. Posterior 10-mm removed.  Drilled, sized and fitted for a 35-mm component.  Debris cleared from all recesses.  Trials put in place #4 above and below and a 11-mm insert and 35 patella.  With this construct, excellent biomechanical axis.  Nicely balanced in flexion and extension. Full motion, good stability, good patellofemoral tracking.  Tibia was marked for rotation and hand reamed.  All trials removed.  Copious irrigation with a pulse irrigating device.  Cement prepared and placed on all components, firmly seated.  Polyethylene attached to the tibia. Knee held in extension.  Patella held with clamp.  Once cement hardened, the knee was reexamined.  Again, pleased with stability alignment and tracking.  Hemovac was placed through a separate stab wound.  Arthrotomy closed with #1 Vicryl.  Skin and subcutaneous tissue with Vicryl and staples.  Sterile compressive dressing applied.  Tourniquet deflated and removed.  Anesthesia reversed and brought to the recovery room. Tolerated surgery well.  No complications.     Loreta Ave, M.D.     DFM/MEDQ  D:  09/21/2011  T:  09/22/2011  Job:  [id number]

## 2011-09-23 LAB — CBC
Platelets: 261 10*3/uL (ref 150–400)
RBC: 3.26 MIL/uL — ABNORMAL LOW (ref 3.87–5.11)
WBC: 8.6 10*3/uL (ref 4.0–10.5)

## 2011-09-23 LAB — BASIC METABOLIC PANEL
CO2: 28 mEq/L (ref 19–32)
Chloride: 97 mEq/L (ref 96–112)
GFR calc Af Amer: >90 mL/min (ref >90)
Potassium: 3.9 mEq/L (ref 3.5–5.1)
Sodium: 132 mEq/L — ABNORMAL LOW (ref 135–145)

## 2011-09-23 LAB — PROTIME-INR
INR: 2.19 — ABNORMAL HIGH (ref 0.00–1.49)
Prothrombin Time: 24.7 seconds — ABNORMAL HIGH (ref 11.6–15.2)

## 2011-09-23 MED ORDER — WARFARIN SODIUM 5 MG PO TABS
5.0000 mg | ORAL_TABLET | Freq: Once | ORAL | Status: DC
  Filled 2011-09-23: qty 1

## 2011-09-23 MED ORDER — BISACODYL 10 MG RE SUPP: 10.0000 mg | Freq: Every day | RECTAL | Status: DC | PRN

## 2011-09-23 NOTE — Progress Notes (Signed)
Subjective: Doing well.pain controlled. No bm yet.   Objective: Vital signs in last 24 hours: Temp:  [98.6 F (37 C)-99.4 F (37.4 C)] 99.4 F (37.4 C) (01/04 0522) Pulse Rate:  [93-99] 94  (01/04 0522) Resp:  [18] 18  (01/04 0522) BP: (104-109)/(61-71) 104/61 mmHg (01/04 0522) SpO2:  [93 %-100 %] 100 % (01/04 0522)  Intake/Output from previous day: 01/03 0701 - 01/04 0700 In: 240 [P.O.:240] Out: 100 [Drains:100] Intake/Output this shift:     Basename 09/23/11 0640 09/22/11 0525  HGB 9.5* 10.3*    Basename 09/23/11 0640 09/22/11 0525  WBC 8.6 6.7  RBC 3.26* 3.50*  HCT 29.2* 31.8*  PLT 261 285    Basename 09/23/11 0640 09/22/11 0525  NA 132* 134*  K 3.9 3.6  CL 97 97  CO2 28 29  BUN 10 7  CREATININE 0.63 0.58  GLUCOSE 119* 101*  CALCIUM 8.9 8.8    Basename 09/23/11 0640 09/22/11 0525  LABPT -- --  INR 2.19* 1.26    Neurovascular intact No cellulitis present Wound looks good.  Staples intact.  Drain removed.  Calf nontender. Assessment/Plan: Possible d/c home today.  Saline lock iv.     Taler Kushner M 09/23/2011, 8:23 AM

## 2011-09-23 NOTE — Progress Notes (Signed)
D/C instructions reviewed with patient and husband. All questions answered. rx x 3 called into pharmacy. Has hh equipment already. [facility] arranged [facility]. Pt d/c;ed via wheelchair in stable condition. Some dsg change supplies given to patient for home use

## 2011-09-23 NOTE — Progress Notes (Signed)
ANTICOAGULATION CONSULT NOTE - Follow Up Consult  Pharmacy Consult for Coumadin Indication: VTE prophylaxis  Allergies  Allergen Reactions  . Ceftin   . Ciprofloxacin   . Codeine   . Strawberry   . Sulfa Antibiotics   . Tomato     Patient Measurements: Height: 5\' 6"  (167.6 cm) Weight: 255 lb (115.667 kg) IBW/kg (Calculated) : 59.3   Vital Signs: Temp: 99.4 F (37.4 C) (01/04 0522) BP: 104/61 mmHg (01/04 0522) Pulse Rate: 94  (01/04 0522)  Labs:  Basename 09/23/11 0640 09/22/11 0525  HGB 9.5* 10.3*  HCT 29.2* 31.8*  PLT 261 285  APTT -- --  LABPROT 24.7* 16.1*  INR 2.19* 1.26  HEPARINUNFRC -- --  CREATININE 0.63 0.58  CKTOTAL -- --  CKMB -- --  TROPONINI -- --   Estimated Creatinine Clearance: 94.3 ml/min (by C-G formula based on Cr of 0.63).   Medications:  Scheduled:     . docusate sodium  100 mg Oral BID  . enoxaparin  30 mg Subcutaneous Q12H  . hydrochlorothiazide  12.5 mg Oral Daily  . olmesartan  40 mg Oral Daily  . pantoprazole  40 mg Oral Q1200  . vancomycin  1,000 mg Intravenous Q12H  . warfarin  10 mg Oral ONCE-1800  . warfarin   Does not apply Once    Assessment: 61 y/o female patient s/p TKR requiring coumadin for dvt px, no bleeding reported. Patient also on lovenox 30mg  SQ bid.  INR up to 2.19 from 1.26 yesterday.   This is 3rd day of coumadin therapy.   Goal of Therapy:  INR 2-3   Plan:  Coumadin 5mg  today at 1800.  If dced home, recommend home on 7.5mg  daily.   Wendie Simmer, PharmD, BCPS Clinical Pharmacist  Pager: [phone] 09/23/2011,10:52 AM

## 2011-09-23 NOTE — Progress Notes (Signed)
Physical Therapy Treatment Patient Details Name: Martha Chambers MRN: [id number] DOB: Jan 19, 1951 Today's Date: 09/23/2011  PT Assessment/Plan  PT - Assessment/Plan Comments on Treatment Session: Pt progressing with mobility.  PT Frequency: 7X/week Follow Up Recommendations: Home health PT PT Goals  Acute Rehab PT Goals PT Goal: Sit to Stand - Progress: Progressing toward goal PT Goal: Stand to Sit - Progress: Progressing toward goal PT Transfer Goal: Bed to Chair/Chair to Bed - Progress: Progressing toward goal PT Goal: Ambulate - Progress: Met PT Goal: Up/Down Stairs - Progress: Progressing toward goal PT Goal: Perform Home Exercise Program - Progress: Progressing toward goal  PT Treatment Precautions/Restrictions  Restrictions Weight Bearing Restrictions: No LLE Weight Bearing: Weight bearing as tolerated Mobility (including Balance) Transfers Sit to Stand: 3: Mod assist;With upper extremity assist;From elevated surface;From bed;6: Modified independent (Device/Increase time) Stand to Sit: 3: Mod assist;With upper extremity assist;To chair/3-in-1;With armrests;6: Modified independent (Device/Increase time) Ambulation/Gait Ambulation/Gait Assistance: 5: Supervision Ambulation Distance (Feet): 180 Feet Assistive device: Rolling walker Gait Pattern: Step-to pattern;Trunk flexed Gait velocity: decreased Stairs: Yes Stairs Assistance: 5: Supervision Stairs Assistance Details (indicate cue type and reason): cues for technique Stair Management Technique: Two rails;Forwards Number of Stairs: 4     Exercise  Total Joint Exercises Ankle Circles/Pumps: AROM;Left;10 reps Quad Sets: AROM;Left;10 reps Short Arc QuadBarbaraann Chambers;Left;10 reps Heel Slides: AAROM;Left;10 reps Straight Leg Raises: AAROM;Left;10 reps End of Session PT - End of Session Equipment Utilized During Treatment: Gait belt Activity Tolerance: Patient tolerated treatment well Patient left: in chair;with call bell in  reach General Behavior During Session: [facility] for tasks performed Cognition: [facility] for tasks performed  Martha Chambers, Adline Potter 09/23/2011, 12:32 PM 09/23/2011 Fredrich Birks PTA [phone] pager [phone] office

## 2011-10-02 NOTE — Discharge Summary (Signed)
   ABBREVIATED DISCHARGE SUMMARY  DATE OF HOSPITALIZATION:  21 Sep 2011    REASON FOR HOSPITALIZATION:  60 YO BF W/ END STAGE DJD LEFT KNEE AND CHRONIC PAIN.  FAILED CONSERVATIVE TX.      SIGNIFICANT FINDINGS:  DJD  OPERATION:  LEFT TKR  FINAL DIAGNOSIS:  SAME   SECONDARY DIAGNOSIS:  NONE   CONSULTANTS:  NONE  DISCHARGE CONDITION:  STABLE  DISCHARGED TO:  HOME

## 2013-05-30 ENCOUNTER — Encounter (HOSPITAL_COMMUNITY): Payer: Self-pay | Admitting: Emergency Medicine

## 2013-05-30 ENCOUNTER — Emergency Department (HOSPITAL_COMMUNITY)
Admission: EM | Admit: 2013-05-30 | Discharge: 2013-05-30 | Disposition: A | Discharge status: Home / Self Care | Payer: BC Managed Care – PPO | Source: Home / Self Care | Attending: Emergency Medicine | Admitting: Emergency Medicine

## 2013-05-30 DIAGNOSIS — L0291 Cutaneous abscess, unspecified: Secondary | ICD-10-CM

## 2013-05-30 MED ORDER — DOXYCYCLINE HYCLATE 100 MG PO TABS: 100.0000 mg | ORAL_TABLET | Freq: Two times a day (BID) | ORAL | Status: DC

## 2013-05-30 MED ORDER — MUPIROCIN 2 % EX OINT: TOPICAL_OINTMENT | Freq: Three times a day (TID) | CUTANEOUS | Status: DC

## 2013-05-30 MED ORDER — SODIUM BICARBONATE 4 % IV SOLN
INTRAVENOUS | Status: AC
  Filled 2013-05-30: qty 5

## 2013-05-30 MED ORDER — HYDROCODONE-ACETAMINOPHEN 5-325 MG PO TABS: ORAL_TABLET | ORAL | Status: DC

## 2013-05-30 NOTE — ED Notes (Signed)
C/o abscess on left ear which started Sunday.  Patient has been putting heat on the abscess.

## 2013-05-30 NOTE — ED Provider Notes (Signed)
Chief Complaint:   Chief Complaint  Patient presents with  . Abscess    History of Present Illness:    Martha Chambers is a 62 year old female who has had a six-day history of a painful boil on her left face in the temple, just in front of the ear. She has a history of recurring abscesses in that same area before and has a history of MRSA. She denies any drainage or fever.  Review of Systems:  Other than noted above, the patient denies any of the following symptoms: Systemic:  No fever, chills or sweats. Skin:  No rash or itching.  PMFSH:  Past medical history, family history, social history, meds, and allergies were reviewed.  No history of diabetes. She has high blood pressure. She's allergic to Ceftin, Cipro, codeine, and sulfa. She takes omeprazole and Diovan/HCTZ.  Physical Exam:   Vital signs:  BP 132/66  Pulse 65  Temp(Src) 98.2 F (36.8 C) (Oral)  Resp 18  SpO2 97% Skin:  There is a 2 cm, raised, elliptical, tender, fluctuant mass in the left temple, just in front of the ear.  Skin exam was otherwise normal.  No rash. Ext:  Distal pulses were full, patient has full ROM of all joints.  Procedure:  Verbal informed consent was obtained.  The patient was informed of the risks and benefits of the procedure and understands and accepts.  Identity of the patient was verified verbally and by wristband.   The abscess area described above was prepped with Betadine and alcohol and anesthetized with 1 mL of 2% Xylocaine with epinephrine.  Using a #11 scalpel blade, a singe straight incision was made into the area of fluctulence, yielding a large amount of prurulent drainage.  Routine cultures were obtained.  Blunt dissection was used to break up loculations and the resulting wound cavity was packed with 1/4 inch Iodoform gauze.  A sterile pressure dressing was applied.  Assessment:  The encounter diagnosis was Abscess.  She has a history of recurring MRSA. She was placed on a MRSA  eradication protocol. Since she can't take sulfa, was given a prescription for doxycycline.  Plan:   1.  Meds:  The following meds were prescribed:   Discharge Medication List as of 05/30/2013  5:01 PM    START taking these medications   Details  doxycycline (VIBRA-TABS) 100 MG tablet Take 1 tablet (100 mg total) by mouth 2 (two) times daily., Starting 05/30/2013, Until Discontinued, Normal    HYDROcodone-acetaminophen (NORCO/VICODIN) 5-325 MG per tablet 1 to 2 tabs every 4 to 6 hours as needed for pain., Print    mupirocin ointment (BACTROBAN) 2 % Apply topically 3 (three) times daily., Starting 05/30/2013, Until Discontinued, Normal        2.  Patient Education/Counseling:  The patient was given appropriate handouts, self care instructions, and instructed in symptomatic relief.  Recommended applying mupirocin ointment to his nostrils 3 times a day for a month and twice weekly Clorox baths for 3 months.  3.  Follow up:  The patient was instructed to leave the dressing in place and return again in 48 hours for packing removal, if becoming worse in any way, and given some red flag symptoms such as fever or worsening pain which would prompt immediate return.  Follow up here if necessary.     Reuben Likes, MD 05/30/13 2151

## 2013-06-02 LAB — CULTURE, ROUTINE-ABSCESS: Culture: NO GROWTH

## 2013-08-05 ENCOUNTER — Other Ambulatory Visit: Payer: Self-pay | Admitting: Radiology

## 2014-07-27 ENCOUNTER — Encounter: Payer: Self-pay | Admitting: *Deleted

## 2014-07-29 ENCOUNTER — Ambulatory Visit: Payer: Self-pay | Admitting: Podiatry

## 2014-07-30 ENCOUNTER — Ambulatory Visit (INDEPENDENT_AMBULATORY_CARE_PROVIDER_SITE_OTHER): Payer: Commercial Managed Care - HMO | Admitting: Podiatry

## 2014-07-30 ENCOUNTER — Encounter: Payer: Self-pay | Admitting: Podiatry

## 2014-07-30 VITALS — BP 136/79 | HR 82 | Resp 16

## 2014-07-30 DIAGNOSIS — L600 Ingrowing nail: Secondary | ICD-10-CM

## 2014-07-30 NOTE — Progress Notes (Signed)
   Subjective:    Patient ID: Martha Chambers, female    DOB: [birth year], 63 y.o.   MRN: [id number]  HPI Comments: "I have pain in this toe"  Patient c/o tenderness 1st toe left, medial border for about 1 years. The nail is thick and dark. She keeps it trimmed. Shoes are uncomfortable. 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  Toe Pain       Review of Systems  HENT: Positive for ear pain, sinus pressure and tinnitus.   Endocrine: Positive for polyuria.  Genitourinary: Positive for frequency.  Musculoskeletal: Positive for arthralgias.  Allergic/Immunologic: Positive for environmental allergies.  All other systems reviewed and are negative.      Objective:   Physical Exam        Assessment & Plan:

## 2014-07-30 NOTE — Patient Instructions (Signed)

## 2014-07-30 NOTE — Progress Notes (Signed)
Subjective:     Patient ID: Martha Chambers, female   DOB: 14-Aug-1951, 63 y.o.   MRN: [id number]  HPIpatient presents stating I have a very painful left big toenail with the medial side hurting the most but the whole nail being thickened and hard for me to cut and sore. States it's been this way for several years   Review of Systems  All other systems reviewed and are negative.      Objective:   Physical Exam  Constitutional: She is oriented to person, place, and time.  Cardiovascular: Intact distal pulses.   Musculoskeletal: Normal range of motion.  Neurological: She is oriented to person, place, and time.  Skin: Skin is warm.  Nursing note and vitals reviewed. neurovascular status intact with muscle strength adequate and range of motion of the subtalar and midtarsal joint. Patient is noted to have good digital perfusion and is well oriented 3 and does have mild equinus condition. 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 Explain permanent procedure and reviewed what would be done and today infiltrated 60 mg Xylocaine Marcaine mixture remove the hallux nail exposed the matrix and applied phenol 5 applications 30 seconds followed by alcohol lavaged and sterile dressing. Prior to procedure I did explain the risk of doing this and the fact will take at least several weeks to heal and patient understands this. Explained soaks and reappoint

## 2014-08-02 ENCOUNTER — Telehealth: Payer: Self-pay | Admitting: Podiatry

## 2014-08-02 NOTE — Telephone Encounter (Signed)
Patient called the call service on 08/01/14 at 8pm stating that her toe was swollen and numb after the procedure. She denies any drainage/purulence as well as erythema or any streaking. No systemic complaints such as fevers, chills, nausea, vomiting. She has been soaking in betadine and covering with antibiotic ointment and a band-aid. I discussed with her she can switch to epsom salt soaks. Monitor for any signs or symptoms of infection and directed to call the office immediately if any are to occur, or go to the ER. If she has continued or any worsening of symptoms, follow up on Monday. Encouraged to call with any further questions/concerns/change in symptoms.

## 2014-08-07 ENCOUNTER — Encounter (HOSPITAL_COMMUNITY): Payer: Self-pay | Admitting: Emergency Medicine

## 2014-08-07 ENCOUNTER — Inpatient Hospital Stay (HOSPITAL_COMMUNITY): Payer: Medicare HMO

## 2014-08-07 ENCOUNTER — Emergency Department (HOSPITAL_COMMUNITY): Payer: Medicare HMO

## 2014-08-07 ENCOUNTER — Inpatient Hospital Stay (HOSPITAL_COMMUNITY)
Admission: EM | Admit: 2014-08-07 | Discharge: 2014-08-08 | DRG: 069 | Disposition: A | Discharge status: Home / Self Care | Payer: Medicare HMO | Attending: Internal Medicine | Admitting: Internal Medicine

## 2014-08-07 DIAGNOSIS — Z7982 Long term (current) use of aspirin: Secondary | ICD-10-CM | POA: Diagnosis not present

## 2014-08-07 DIAGNOSIS — K219 Gastro-esophageal reflux disease without esophagitis: Secondary | ICD-10-CM | POA: Diagnosis present

## 2014-08-07 DIAGNOSIS — I10 Essential (primary) hypertension: Secondary | ICD-10-CM | POA: Diagnosis present

## 2014-08-07 DIAGNOSIS — K210 Gastro-esophageal reflux disease with esophagitis: Secondary | ICD-10-CM

## 2014-08-07 DIAGNOSIS — H538 Other visual disturbances: Secondary | ICD-10-CM

## 2014-08-07 DIAGNOSIS — Z91018 Allergy to other foods: Secondary | ICD-10-CM | POA: Diagnosis not present

## 2014-08-07 DIAGNOSIS — M1990 Unspecified osteoarthritis, unspecified site: Secondary | ICD-10-CM | POA: Diagnosis present

## 2014-08-07 DIAGNOSIS — Z885 Allergy status to narcotic agent status: Secondary | ICD-10-CM | POA: Diagnosis not present

## 2014-08-07 DIAGNOSIS — Z882 Allergy status to sulfonamides status: Secondary | ICD-10-CM | POA: Diagnosis not present

## 2014-08-07 DIAGNOSIS — G459 Transient cerebral ischemic attack, unspecified: Secondary | ICD-10-CM | POA: Diagnosis present

## 2014-08-07 DIAGNOSIS — Z96652 Presence of left artificial knee joint: Secondary | ICD-10-CM | POA: Diagnosis present

## 2014-08-07 DIAGNOSIS — Z96651 Presence of right artificial knee joint: Secondary | ICD-10-CM | POA: Diagnosis present

## 2014-08-07 DIAGNOSIS — R42 Dizziness and giddiness: Secondary | ICD-10-CM | POA: Diagnosis not present

## 2014-08-07 DIAGNOSIS — G932 Benign intracranial hypertension: Secondary | ICD-10-CM | POA: Diagnosis present

## 2014-08-07 DIAGNOSIS — Z881 Allergy status to other antibiotic agents status: Secondary | ICD-10-CM | POA: Diagnosis not present

## 2014-08-07 LAB — URINALYSIS, ROUTINE W REFLEX MICROSCOPIC
BILIRUBIN URINE: NEGATIVE
Glucose, UA: NEGATIVE mg/dL
HGB URINE DIPSTICK: NEGATIVE
KETONES UR: NEGATIVE mg/dL
NITRITE: NEGATIVE
PROTEIN: NEGATIVE mg/dL
Specific Gravity, Urine: 1.009 (ref 1.005–1.030)
UROBILINOGEN UA: 0.2 mg/dL (ref 0.0–1.0)
pH: 6.5 (ref 5.0–8.0)

## 2014-08-07 LAB — COMPREHENSIVE METABOLIC PANEL
ALK PHOS: 77 U/L (ref 39–117)
ALT: 16 U/L (ref 0–35)
ANION GAP: 13 (ref 5–15)
AST: 18 U/L (ref 0–37)
Albumin: 3.6 g/dL (ref 3.5–5.2)
BUN: 13 mg/dL (ref 6–23)
CALCIUM: 9.3 mg/dL (ref 8.4–10.5)
CO2: 24 meq/L (ref 19–32)
Chloride: 96 mEq/L (ref 96–112)
Creatinine, Ser: 0.67 mg/dL (ref 0.50–1.10)
GLUCOSE: 114 mg/dL — AB (ref 70–99)
POTASSIUM: 4 meq/L (ref 3.7–5.3)
SODIUM: 133 meq/L — AB (ref 137–147)
Total Bilirubin: 0.2 mg/dL — ABNORMAL LOW (ref 0.3–1.2)
Total Protein: 7.6 g/dL (ref 6.0–8.3)

## 2014-08-07 LAB — LIPID PANEL
CHOL/HDL RATIO: 4.4 ratio
Cholesterol: 153 mg/dL (ref 0–200)
HDL: 35 mg/dL — AB (ref >39)
LDL Cholesterol: 89 mg/dL (ref 0–99)
Triglycerides: 147 mg/dL (ref <150)
VLDL: 29 mg/dL (ref 0–40)

## 2014-08-07 LAB — CBC WITH DIFFERENTIAL/PLATELET
Basophils Absolute: 0 10*3/uL (ref 0.0–0.1)
Basophils Relative: 0 % (ref 0–1)
EOS ABS: 0.1 10*3/uL (ref 0.0–0.7)
EOS PCT: 2 % (ref 0–5)
HCT: 37.2 % (ref 36.0–46.0)
HEMOGLOBIN: 12.2 g/dL (ref 12.0–15.0)
LYMPHS ABS: 1.8 10*3/uL (ref 0.7–4.0)
LYMPHS PCT: 33 % (ref 12–46)
MCH: 29 pg (ref 26.0–34.0)
MCHC: 32.8 g/dL (ref 30.0–36.0)
MCV: 88.6 fL (ref 78.0–100.0)
MONOS PCT: 9 % (ref 3–12)
Monocytes Absolute: 0.5 10*3/uL (ref 0.1–1.0)
Neutro Abs: 3.1 10*3/uL (ref 1.7–7.7)
Neutrophils Relative %: 56 % (ref 43–77)
PLATELETS: 254 10*3/uL (ref 150–400)
RBC: 4.2 MIL/uL (ref 3.87–5.11)
RDW: 13.5 % (ref 11.5–15.5)
WBC: 5.5 10*3/uL (ref 4.0–10.5)

## 2014-08-07 LAB — HEMOGLOBIN A1C
Hgb A1c MFr Bld: 6.1 % — ABNORMAL HIGH (ref <5.7)
Mean Plasma Glucose: 128 mg/dL — ABNORMAL HIGH (ref <117)

## 2014-08-07 LAB — TROPONIN I: Troponin I: <0.3 ng/mL (ref <0.30)

## 2014-08-07 LAB — CBG MONITORING, ED: Glucose-Capillary: 117 mg/dL — ABNORMAL HIGH (ref 70–99)

## 2014-08-07 LAB — PROTIME-INR
INR: 1.02 (ref 0.00–1.49)
Prothrombin Time: 13.5 seconds (ref 11.6–15.2)

## 2014-08-07 LAB — I-STAT TROPONIN, ED: Troponin i, poc: 0 ng/mL (ref 0.00–0.08)

## 2014-08-07 LAB — URINE MICROSCOPIC-ADD ON

## 2014-08-07 MED ORDER — HEPARIN SODIUM (PORCINE) 5000 UNIT/ML IJ SOLN
5000.0000 [IU] | Freq: Three times a day (TID) | INTRAMUSCULAR | Status: DC
  Administered 2014-08-07 – 2014-08-08 (×3): 5000 [IU] via SUBCUTANEOUS
  Filled 2014-08-07 (×3): qty 1

## 2014-08-07 MED ORDER — MUPIROCIN 2 % EX OINT
TOPICAL_OINTMENT | Freq: Three times a day (TID) | CUTANEOUS | Status: DC
  Administered 2014-08-07: 1 via TOPICAL
  Administered 2014-08-07 – 2014-08-08 (×2): via TOPICAL
  Filled 2014-08-07: qty 22

## 2014-08-07 MED ORDER — LORAZEPAM 2 MG/ML IJ SOLN
1.0000 mg | Freq: Once | INTRAMUSCULAR | Status: AC
  Administered 2014-08-07: 1 mg via INTRAVENOUS

## 2014-08-07 MED ORDER — ASPIRIN EC 325 MG PO TBEC
325.0000 mg | DELAYED_RELEASE_TABLET | Freq: Every day | ORAL | Status: DC
  Administered 2014-08-07 – 2014-08-08 (×2): 325 mg via ORAL
  Filled 2014-08-07 (×2): qty 1

## 2014-08-07 MED ORDER — LORAZEPAM 2 MG/ML IJ SOLN
INTRAMUSCULAR | Status: AC
  Administered 2014-08-07: 1 mg via INTRAVENOUS
  Filled 2014-08-07: qty 1

## 2014-08-07 MED ORDER — SENNOSIDES-DOCUSATE SODIUM 8.6-50 MG PO TABS: 1.0000 | ORAL_TABLET | Freq: Every evening | ORAL | Status: DC | PRN

## 2014-08-07 MED ORDER — PANTOPRAZOLE SODIUM 40 MG PO TBEC
40.0000 mg | DELAYED_RELEASE_TABLET | Freq: Every day | ORAL | Status: DC
  Administered 2014-08-07 – 2014-08-08 (×2): 40 mg via ORAL
  Filled 2014-08-07 (×2): qty 1

## 2014-08-07 MED ORDER — STROKE: EARLY STAGES OF RECOVERY BOOK
Freq: Once | Status: AC
  Administered 2014-08-07: 06:00:00
  Filled 2014-08-07: qty 1

## 2014-08-07 MED ORDER — HYDROCODONE-ACETAMINOPHEN 5-325 MG PO TABS: 1.0000 | ORAL_TABLET | Freq: Four times a day (QID) | ORAL | Status: DC | PRN

## 2014-08-07 NOTE — Progress Notes (Signed)
PT Cancellation Note  Patient Details Name: Martha Chambers MRN: [id number] DOB: Feb 22, 1951   Cancelled Treatment:    Reason Eval Not Completed: Duplicate PT orders (entered at same time) with one order set for PT to begin 08/08/14. No current activity orders. Will defer PT evaluation to 08/08/14 unless otherwise clarified by MD to begin today.   Swati Granberry 08/07/2014, 7:59 AM  Pager [phone]

## 2014-08-07 NOTE — ED Notes (Signed)
Pt. woke up this morning with brief episode of blurred vision / dizziness and mild SOB , symptoms resolved at triage , alert and oriented , speech clear with no facial asymmetry.

## 2014-08-07 NOTE — Progress Notes (Signed)
Pt arrived to 4N03 from ED. Placed on tele. Husband currently at bedside. Oriented to room and equipment. Will continue to monitor.

## 2014-08-07 NOTE — Plan of Care (Signed)
Problem: Acute Treatment Outcomes Goal: Neuro exam at baseline or improved Outcome: Completed/Met Date Met:  08/07/14 Goal: BP within ordered parameters Outcome: Completed/Met Date Met:  08/07/14 Goal: Airway maintained/protected Outcome: Completed/Met Date Met:  08/07/14 Goal: 02 Sats > 94% Outcome: Completed/Met Date Met:  08/07/14  Problem: Progression Outcomes Goal: Communication method established Outcome: Completed/Met Date Met:  08/07/14 Goal: If vent dependent, tolerates weaning Outcome: Not Applicable Date Met:  17/51/02 Goal: Progressive activity as tolerated Outcome: Completed/Met Date Met:  08/07/14 Goal: Tolerating diet/TF at goal rate Outcome: Completed/Met Date Met:  08/07/14 Goal: Pain controlled Outcome: Completed/Met Date Met:  08/07/14 Goal: Bowel & Bladder Continence Outcome: Completed/Met Date Met:  08/07/14 Goal: Educational plan initiated Outcome: Completed/Met Date Met:  08/07/14 Goal: Initial discharge plan initiated Outcome: Completed/Met Date Met:  08/07/14

## 2014-08-07 NOTE — Evaluation (Signed)
Physical Therapy Evaluation and Discharge Patient Details Name: Martha Chambers MRN: [id number] DOB: 31-May-1951 Today's Date: 08/07/2014   History of Present Illness  Adm 08/07/14 after episode of blurred vision and LLE weakness. MRI head negative PMHx-HTN, OA  Clinical Impression  Pt admitted with above symptoms with stroke work-up negative to this point. Symptoms have resolved. Pt with no h/o balance problems and demonstrated independence with PT. No further needs identified and pt/family in agreement.     Follow Up Recommendations No PT follow up    Equipment Recommendations  None recommended by PT    Recommendations for Other Services       Precautions / Restrictions        Mobility  Bed Mobility Overal bed mobility: Modified Independent                Transfers Overall transfer level: Independent Equipment used: None                Ambulation/Gait Ambulation/Gait assistance: Independent Ambulation Distance (Feet): 120 Feet Assistive device: None Gait Pattern/deviations: Antalgic (due to recent Rt 1st toe surgery) Gait velocity: slow due to pain, can incr or decr with cues      Stairs            Wheelchair Mobility    Modified Rankin (Stroke Patients Only)       Balance Overall balance assessment: Independent Sitting-balance support: No upper extremity supported;Feet unsupported Sitting balance-Leahy Scale: Good     Standing balance support: No upper extremity supported Standing balance-Leahy Scale: Good           Rhomberg - Eyes Opened: 30 Rhomberg - Eyes Closed: 30 High level balance activites: Backward walking;Direction changes;Turns;Sudden stops;Head turns High Level Balance Comments: no difficulties             Pertinent Vitals/Pain Pain Assessment: Faces Faces Pain Scale: Hurts little more Pain Location: Rt great toe (toe nail recently surgically removed) Pain Intervention(s): Limited activity within patient's  tolerance;Monitored during session    [name] expects to be discharged to:: Private residence Living Arrangements: Spouse/significant other Available Help at Discharge: Family;Available 24 hours/day Type of Home: House Home Access: Stairs to enter;Level entry (level if goes thru garage) Entrance Stairs-Rails: None Technical brewer of Steps: 2 Home Layout: Two level;Able to live on main level with bedroom/bathroom Home Equipment: Gilford Rile - 2 wheels;Adaptive equipment;Shower seat (stair lift )      Prior Function Level of Independence: Independent         Comments: works part-time as Corporate treasurer at [employer]: Right    Extremity/Trunk Assessment   Upper Extremity Assessment: Defer to OT evaluation           Lower Extremity Assessment: Overall WFL for tasks assessed      Cervical / Trunk Assessment: Normal  Communication   Communication: No difficulties  Cognition Arousal/Alertness: Awake/alert Behavior During Therapy: WFL for tasks assessed/performed Overall Cognitive Status: Within Functional Limits for tasks assessed                      General Comments General comments (skin integrity, edema, etc.): husband present    Exercises        Assessment/Plan    PT Assessment Patent does not need any further PT services  PT Diagnosis Difficulty walking   PT Problem List    PT Treatment Interventions     PT Goals (Current goals  can be found in the Care Plan section) Acute Rehab PT Goals PT Goal Formulation: All assessment and education complete, DC therapy    Frequency     Barriers to discharge        Co-evaluation               End of Session   Activity Tolerance: Patient tolerated treatment well Patient left: in chair;with family/visitor present Nurse Communication: Mobility status (OK to be up alone; sign on door indicating)         Time: 7494-4967 PT Time Calculation (min)  (ACUTE ONLY): 13 min   Charges:   PT Evaluation $Initial PT Evaluation Tier I: 1 Procedure     PT G Codes:          Martha Chambers 08-19-14, 3:25 PM  Pager [phone]

## 2014-08-07 NOTE — H&P (Signed)
[facility] History and Physical  Martha Chambers EXB:[id number] DOB: 1951-08-22 DOA: 08/07/2014  Referring physician: ED physician PCP: Henrine Screws, MD  Specialists:   Chief Complaint: blurred vision, dizziness, left leg weakness,  HPI: Martha Chambers is a 63 y.o. female with past medical history of hypertension, GERD, arthritis, who presents with blurred vision, dizziness, left leg weakness.  Patient reports that she woke up at midnight and felt dizzy, and near syncope. She also has left leg weakness, blurred vision, mild chest discomfort and mild shortness of breath. All symptoms lasted for about 45 minutes, and resolved spontaneously. She reports that she has left arm pain the past week. Currently she has mild difficulty in swallowing. Patient does not have fever, chills, cough, nausea, vomiting, diarrhea, dysuria, hematochezia, hematuria, rashes, leg edema.  Work up in the ED demonstrates negative CT head for acute abnormalities. Troponin negative. INR 1.02. She is admitted to inpatient for further evaluation and treatment.  Review of Systems: As presented in the history of presenting illness, rest negative.  Where does patient live?  At home Can patient participate in ADLs? Yes  Allergy:  Allergies  Allergen Reactions  . Ciprofloxacin Shortness Of Breath and Rash  . Sulfa Antibiotics Shortness Of Breath and Rash  . Tomato Shortness Of Breath and Swelling    Facial swelling  . Strawberry Swelling    Facial swelling  . Ceftin Rash  . Codeine Rash    Past Medical History  Diagnosis Date  . Hypertension   . GERD (gastroesophageal reflux disease)   . Arthritis     Past Surgical History  Procedure Laterality Date  . Cholecystectomy    . Tonsillectomy    . Knee arthroplasty  07    rt  . Abdominal hysterectomy    . Total knee arthroplasty  09/21/2011    Procedure: TOTAL KNEE ARTHROPLASTY;  Surgeon: Ninetta Lights, MD;  Location: [place];  Service:  Orthopedics;  Laterality: Left;  120 MINUTES FOR THIS SURGERY    Social History:  reports that she has never smoked. She does not have any smokeless tobacco history on file. She reports that she does not drink alcohol or use illicit drugs.  Family History:  Family History  Problem Relation Age of Onset  . Anesthesia problems Neg Hx   . Hypotension Neg Hx   . Malignant hyperthermia Neg Hx   . Pseudochol deficiency Neg Hx      Prior to Admission medications   Medication Sig Start Date End Date Taking? Authorizing Provider  aspirin EC 81 MG tablet Take 81 mg by mouth daily.   Yes Historical Provider, MD  losartan-hydrochlorothiazide (HYZAAR) 100-25 MG per tablet Take 1 tablet by mouth daily.   Yes Historical Provider, MD  metoprolol succinate (TOPROL-XL) 25 MG 24 hr tablet Take 25 mg by mouth daily.   Yes Historical Provider, MD  omeprazole (PRILOSEC) 20 MG capsule Take 20 mg by mouth daily.     Yes Historical Provider, MD  doxycycline (VIBRA-TABS) 100 MG tablet Take 1 tablet (100 mg total) by mouth 2 (two) times daily. 05/30/13   Harden Mo, MD  HYDROcodone-acetaminophen (NORCO/VICODIN) 5-325 MG per tablet 1 to 2 tabs every 4 to 6 hours as needed for pain. 05/30/13   Harden Mo, MD  mupirocin ointment (BACTROBAN) 2 % Apply topically 3 (three) times daily. 05/30/13   Harden Mo, MD  valsartan-hydrochlorothiazide (DIOVAN-HCT) 320-12.5 MG per tablet Take 1 tablet by mouth daily.  Historical Provider, MD    Physical Exam: Filed Vitals:   08/07/14 0430 08/07/14 0455 08/07/14 0500 08/07/14 0519  BP: 110/47  114/59 126/58  Pulse: 69  78 70  Temp:  97.7 F (36.5 C)    TempSrc:      Resp: 12  16 15   SpO2: 97%  100% 94%   General: Not in acute distress HEENT:       Eyes: PERRL, EOMI, no scleral icterus       ENT: No discharge from the ears and nose, no pharynx injection, no tonsillar enlargement.        Neck: No JVD, no bruit, no mass felt. Cardiac: S1/S2, RRR, No murmurs,  No gallops or rubs Pulm: Good air movement bilaterally. Clear to auscultation bilaterally. No rales, wheezing, rhonchi or rubs. Abd: Soft, nondistended, nontender, no rebound pain, no organomegaly, BS present Ext: No edema bilaterally. 2+DP/PT pulse bilaterally Musculoskeletal: No joint deformities, erythema, or stiffness, ROM full Skin: No rashes.  Neuro: Alert and oriented X3, cranial nerves II-XII grossly intact, muscle strength 5/5 in all extremeties, sensation to light touch intact. Brachial reflex 2+ bilaterally. Knee reflex 1+ bilaterally. Negative Babinski's sign. Normal finger to nose test. Psych: Patient is not psychotic, no suicidal or hemocidal ideation.  Labs on Admission:  Basic Metabolic Panel:  Recent Labs Lab 08/07/14 0200  NA 133*  K 4.0  CL 96  CO2 24  GLUCOSE 114*  BUN 13  CREATININE 0.67  CALCIUM 9.3   Liver Function Tests:  Recent Labs Lab 08/07/14 0200  AST 18  ALT 16  ALKPHOS 77  BILITOT 0.2*  PROT 7.6  ALBUMIN 3.6   No results for input(s): LIPASE, AMYLASE in the last 168 hours. No results for input(s): AMMONIA in the last 168 hours. CBC:  Recent Labs Lab 08/07/14 0200  WBC 5.5  NEUTROABS 3.1  HGB 12.2  HCT 37.2  MCV 88.6  PLT 254   Cardiac Enzymes: No results for input(s): CKTOTAL, CKMB, CKMBINDEX, TROPONINI in the last 168 hours.  BNP (last 3 results) No results for input(s): PROBNP in the last 8760 hours. CBG:  Recent Labs Lab 08/07/14 0159  GLUCAP 117*    Radiological Exams on Admission: Ct Head Wo Contrast  08/07/2014   CLINICAL DATA:  Blurry vision and dizziness when getting up to use bathroom.  EXAM: CT HEAD WITHOUT CONTRAST  TECHNIQUE: Contiguous axial images were obtained from the base of the skull through the vertex without intravenous contrast.  COMPARISON:  None.  FINDINGS: The ventricles and sulci are normal for age. No intraparenchymal hemorrhage, mass effect nor midline shift. Patchy supratentorial white matter  hypodensities are less than expected for patient's age and though non-specific suggest sequelae of chronic small vessel ischemic disease. No acute large vascular territory infarcts.  No abnormal extra-axial fluid collections. Basal cisterns are patent. Mild calcific atherosclerosis of the carotid siphons.  No skull fracture. The included ocular globes and orbital contents are non-suspicious. Possible remote LEFT medial orbital blowout fracture versus prominent recesses the anterior ethmoid artery. The mastoid aircells and included paranasal sinuses are well-aerated.  IMPRESSION: No acute intracranial process ; normal noncontrast CT of the head for age.   Electronically Signed   By: Elon Alas   On: 08/07/2014 02:27    EKG: Independently reviewed. First degree AV block, low voltage.  Assessment/Plan Active Problems:   GERD (gastroesophageal reflux disease)   Arthritis   HTN (hypertension)   TIA (transient ischemic attack)  TIA: patient's  presentation most likely due to TIA. No signs of infection for etiology. - will admit to tele bed and do TIA work up - Risk factor modification: HgbA1c, fasting lipid panel  - MRI, MRA of the brain without contrast  - PT consult, OT consult, Speech consult  - NPO until SLP done - 2 d Echocardiogram  - Ekg  - Carotid dopplers  - Aspirin 325 mg per day  - trop x 3 given chest discomfort  HTN: Hyzaar and metoprolol. Blood pressure is 119/69 on admission. -hold blood pressure medications in the setting of acute TIA.  GERD: continue PPI   DVT ppx: SQ Heparin  Code Status: Full code Family Communication:        Yes, patient's  husband     at bed side Disposition Plan: Admit to inpatient   Date of Service 08/07/2014    Ivor Costa [facility] Pager [phone]  If 7PM-7AM, please contact night-coverage [email or web address] Password [email or web address] 08/07/2014, 5:31 AM

## 2014-08-07 NOTE — ED Notes (Signed)
Attempted report 

## 2014-08-07 NOTE — Evaluation (Signed)
Speech Language Pathology Evaluation Patient Details Name: Martha Chambers MRN: [id number] DOB: 1951/05/03 Today's Date: 08/07/2014 Time: 8182-9937 SLP Time Calculation (min) (ACUTE ONLY): 15 min  Problem List:  Patient Active Problem List   Diagnosis Date Noted  . HTN (hypertension) 08/07/2014  . TIA (transient ischemic attack) 08/07/2014  . GERD (gastroesophageal reflux disease)   . Arthritis    Past Medical History:  Past Medical History  Diagnosis Date  . Hypertension   . GERD (gastroesophageal reflux disease)   . Arthritis    Past Surgical History:  Past Surgical History  Procedure Laterality Date  . Cholecystectomy    . Tonsillectomy    . Knee arthroplasty  07    rt  . Abdominal hysterectomy    . 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  Pt CT head negative.  PMH + for HTN, GERD - no previous TIAs, CVAs.  Speech eval ordered as part of stroke work up.    Assessment / Plan / Recommendation Clinical Impression  Pt presents with functional speech/language skills.  No dysarthria or aphasia noted.  Pt able to name all of her home medications and address.  She was able to read and comprehend at multiple paragraph level as well as write at same level.    No SLP needs identified- pt agreed.  SLP to sign off.      SLP Assessment  Patient does not need any further Speech Lanaguage Pathology Services    Follow Up Recommendations  None    Frequency and Duration     n/a   Pertinent Vitals/Pain Pain Assessment: No/denies pain   SLP Goals  Patient/Family Stated Goal: none stated  SLP Evaluation Prior Functioning  Cognitive/Linguistic Baseline: Within functional limits Type of Home: House  Lives With:  Spouse Education: nursing degree Vocation: Part time employment   Cognition  Overall Cognitive Status: Within Functional Limits for tasks assessed Arousal/Alertness: Awake/alert Orientation Level: Oriented X4 Attention: Focused Focused Attention: Appears intact Memory: Appears intact Awareness: Appears intact Problem Solving: Appears intact Safety/Judgment: Appears intact    Comprehension  Auditory Comprehension Overall Auditory Comprehension: Appears within functional limits for tasks assessed Conversation: Complex Visual Recognition/Discrimination Discrimination: Not tested Reading Comprehension Reading Status: Within funtional limits    Expression Expression Primary Mode of Expression: Verbal Verbal Expression Overall Verbal Expression: Appears within functional limits for tasks assessed Initiation: No impairment Repetition: No impairment Naming: Not tested Pragmatics: No impairment Other Verbal Expression Comments: named 15 animals in 60 seconds Written Expression Dominant Hand: Right Written Expression: Within Functional Limits   Oral / Motor Oral Motor/Sensory Function Overall Oral Motor/Sensory Function: Appears within functional limits for tasks assessed Motor Speech Overall Motor Speech: Appears within functional limits for tasks assessed   GO     Claudie Fisherman, [facility] SLP [phone]

## 2014-08-07 NOTE — ED Provider Notes (Signed)
CSN: [id number]     Arrival date & time 08/07/14  0059 History   First MD Initiated Contact with Patient 08/07/14 0138     Chief Complaint  Patient presents with  . Blurred Vision     (Consider location/radiation/quality/duration/timing/severity/associated sxs/prior Treatment) HPI 63 year old female presents to the emergency department with complaint of blurred vision this evening.  Patient reports that she woke up at midnight and felt dizzy, described as near syncope, diffuse pain in her abdomen, and blurred vision.  She describes the blurred vision as halos and waves around all objects.  Symptoms lasted about an hour, and all has since resolved.  No prior history of same.  She denies any weakness, numbness, headache, chest pain, shortness of breath faculties speaking or walking during this period of blurred vision.  Patient has history of hypertension.  She denies history of smoking or current smoking.  No family history of TIA or stroke. Past Medical History  Diagnosis Date  . Hypertension   . GERD (gastroesophageal reflux disease)   . Arthritis    Past Surgical History  Procedure Laterality Date  . Cholecystectomy    . Tonsillectomy    . Knee arthroplasty  07    rt  . Abdominal hysterectomy    . Total knee arthroplasty  09/21/2011    Procedure: TOTAL KNEE ARTHROPLASTY;  Surgeon: Ninetta Lights, MD;  Location: [place];  Service: Orthopedics;  Laterality: Left;  120 MINUTES FOR THIS SURGERY   Family History  Problem Relation Age of Onset  . Anesthesia problems Neg Hx   . Hypotension Neg Hx   . Malignant hyperthermia Neg Hx   . Pseudochol deficiency Neg Hx    History  Substance Use Topics  . Smoking status: Never Smoker   . Smokeless tobacco: Not on file  . Alcohol Use: No   OB History    No data available     Review of Systems   See History of Present Illness; otherwise all other systems are reviewed and negative  Allergies  Ceftin; Ciprofloxacin; Codeine;  Strawberry; Sulfa antibiotics; and Tomato  Home Medications   Prior to Admission medications   Medication Sig Start Date End Date Taking? Authorizing Provider  doxycycline (VIBRA-TABS) 100 MG tablet Take 1 tablet (100 mg total) by mouth 2 (two) times daily. 05/30/13   Harden Mo, MD  HYDROcodone-acetaminophen (NORCO/VICODIN) 5-325 MG per tablet 1 to 2 tabs every 4 to 6 hours as needed for pain. 05/30/13   Harden Mo, MD  mupirocin ointment (BACTROBAN) 2 % Apply topically 3 (three) times daily. 05/30/13   Harden Mo, MD  omeprazole (PRILOSEC) 20 MG capsule Take 20 mg by mouth daily.      Historical Provider, MD  valsartan-hydrochlorothiazide (DIOVAN-HCT) 320-12.5 MG per tablet Take 1 tablet by mouth daily.      Historical Provider, MD   BP 123/53 mmHg  Pulse 73  Temp(Src) 97.8 F (36.6 C) (Oral)  Resp 18  SpO2 99% Physical Exam  Constitutional: She is oriented to person, place, and time. She appears well-developed and well-nourished.  HENT:  Head: Normocephalic and atraumatic.  Right Ear: External ear normal.  Left Ear: External ear normal.  Nose: Nose normal.  Mouth/Throat: Oropharynx is clear and moist.  Eyes: Conjunctivae and EOM are normal. Pupils are equal, round, and reactive to light.  Neck: Normal range of motion. Neck supple. No JVD present. No tracheal deviation present. No thyromegaly present.  Cardiovascular: Normal rate, regular rhythm, normal heart  sounds and intact distal pulses.  Exam reveals no gallop and no friction rub.   No murmur heard. Pulmonary/Chest: Effort normal and breath sounds normal. No stridor. No respiratory distress. She has no wheezes. She has no rales. She exhibits no tenderness.  Abdominal: Soft. Bowel sounds are normal. She exhibits no distension and no mass. There is no tenderness. There is no rebound and no guarding.  Musculoskeletal: Normal range of motion. She exhibits no edema or tenderness.  Lymphadenopathy:    She has no cervical  adenopathy.  Neurological: She is alert and oriented to person, place, and time. She displays normal reflexes. No cranial nerve deficit. She exhibits normal muscle tone. Coordination normal.  Skin: Skin is warm and dry. No rash noted. No erythema. No pallor.  Psychiatric: She has a normal mood and affect. Her behavior is normal. Judgment and thought content normal.  Nursing note and vitals reviewed.   ED Course  Procedures (including critical care time) Labs Review Labs Reviewed  COMPREHENSIVE METABOLIC PANEL - Abnormal; Notable for the following:    Sodium 133 (*)    Glucose, Bld 114 (*)    Total Bilirubin 0.2 (*)    All other components within normal limits  URINALYSIS, ROUTINE W REFLEX MICROSCOPIC - Abnormal; Notable for the following:    Leukocytes, UA TRACE (*)    All other components within normal limits  CBG MONITORING, ED - Abnormal; Notable for the following:    Glucose-Capillary 117 (*)    All other components within normal limits  CBC WITH DIFFERENTIAL  PROTIME-INR  URINE MICROSCOPIC-ADD ON  I-STAT TROPOININ, ED    Imaging Review Ct Head Wo Contrast  08/07/2014   CLINICAL DATA:  Blurry vision and dizziness when getting up to use bathroom.  EXAM: CT HEAD WITHOUT CONTRAST  TECHNIQUE: Contiguous axial images were obtained from the base of the skull through the vertex without intravenous contrast.  COMPARISON:  None.  FINDINGS: The ventricles and sulci are normal for age. No intraparenchymal hemorrhage, mass effect nor midline shift. Patchy supratentorial white matter hypodensities are less than expected for patient's age and though non-specific suggest sequelae of chronic small vessel ischemic disease. No acute large vascular territory infarcts.  No abnormal extra-axial fluid collections. Basal cisterns are patent. Mild calcific atherosclerosis of the carotid siphons.  No skull fracture. The included ocular globes and orbital contents are non-suspicious. Possible remote LEFT  medial orbital blowout fracture versus prominent recesses the anterior ethmoid artery. The mastoid aircells and included paranasal sinuses are well-aerated.  IMPRESSION: No acute intracranial process ; normal noncontrast CT of the head for age.   Electronically Signed   By: Elon Alas   On: 08/07/2014 02:27     EKG Interpretation   Date/Time:  Thursday August 07 2014 02:24:09 EST Ventricular Rate:  76 PR Interval:  204 QRS Duration: 90 QT Interval:  388 QTC Calculation: 436 R Axis:   38 Text Interpretation:  Sinus rhythm Low voltage, precordial leads  Borderline T abnormalities, anterior leads No significant change since  last tracing Confirmed by Cully Luckow  MD, Ismail Graziani ([id number]) on 08/07/2014 3:17:26 AM      MDM   Final diagnoses:  Blurred vision  Transient cerebral ischemia, unspecified transient cerebral ischemia type    63 year old female with blurred vision, dizziness upon waking tonight at midnight.  Concern for TIA.  Will start workup.    Kalman Drape, MD 08/07/14 [phone]

## 2014-08-07 NOTE — Progress Notes (Signed)
PROGRESS NOTE  JULE WHITSEL DTO:[id number] DOB: 1951/07/27 DOA: 08/07/2014 PCP: Henrine Screws, MD  HPI/Subjective: 63 yo female who awoke last night at midnight with lightheadness and blurred vision.  She also noted weakness in her left leg, pain in her left arm, mild substernal chest pressure, and shortness of breath.  This episode lasted approximately 45 minutes and resolved spontanteously.  She has never experienced an episode like this before.  She denies any ha, fever, chills, cough, nausea, vomiting, diarrhea, dysuria, hematochezia, hematuria, rash, or leg edema.  Denies any personal or family hx of stroke or MI.         Currently pt reports she is doing well and symptoms have not returned.    Assessment/Plan:  Suspected TIA: Symptoms likely due to TIA.  Other causes ruled out.  Head CT shows no acute changes.  Troponin negative.  Blood glucose 114.  Lipid panel-LDL 89-not at goal (<70) decreased HDL.  MRI of brain, 2D echo, and carotid duplex pending.  Will resume HTN medications at discharge.  Continue ASA 325 mg.  Hypertension: Chronic problem.  Well controlled.  Last BP 120/66.  Will resume HTN medications at discharge.   GERD: Chronic problem.  Controlled with Prilosec at home.   Arthritis: Chronic problem.  Controlled.     DVT Prophylaxis:  Heparin 5,000units q8h  Code Status: Full Family Communication: Pt is awake and alert.  Disposition Plan: Will discharge to home when medically appropriate.     Consultants:  None  Procedures:  2D Echo  Carotid Duplex  Antibiotics:  None  Objective: Filed Vitals:   08/07/14 0500 08/07/14 0519 08/07/14 0547 08/07/14 0554  BP: 114/59 126/58 121/46   Pulse: 78 70 71   Temp:   98 F (36.7 C)   TempSrc:   Oral   Resp: _0 Height:    _1  (1.651 m)  Weight:    127.1 kg (280 lb 3.3 oz)  SpO2: 100% 94% 97%    No intake or output data in the 24 hours ending 08/07/14 0915 Filed Weights   08/07/14 0554    Weight: 127.1 kg (280 lb 3.3 oz)    Exam: General: Well developed, well nourished, NAD, appears stated age  41:  Anicteic Sclera, MMM.  Cardiovascular: RRR, S1 S2 auscultated, no rubs, murmurs or gallops.   Respiratory: Clear to auscultation bilaterally with equal chest rise  Abdomen: Soft, nondistended, + bowel sounds.  Tenderness to deep palpation of epigastric region.   Extremities: warm dry without cyanosis clubbing or edema.  Neuro: AAOx3, cranial nerves grossly intact. Strength 5/5 in upper and lower extremities  Skin: Without rashes exudates or nodules.   Psych: Normal affect and demeanor with intact judgement and insight   Data Reviewed: Basic Metabolic Panel:  Recent Labs Lab 08/07/14 0200  NA 133*  K 4.0  CL 96  CO2 24  GLUCOSE 114*  BUN 13  CREATININE 0.67  CALCIUM 9.3   Liver Function Tests:  Recent Labs Lab 08/07/14 0200  AST 18  ALT 16  ALKPHOS 77  BILITOT 0.2*  PROT 7.6  ALBUMIN 3.6   CBC:  Recent Labs Lab 08/07/14 0200  WBC 5.5  NEUTROABS 3.1  HGB 12.2  HCT 37.2  MCV 88.6  PLT 254   Cardiac Enzymes:  Recent Labs Lab 08/07/14 0705  TROPONINI <0.30   CBG:  Recent Labs Lab 08/07/14 0159  GLUCAP 117*    Studies: Ct Head Wo Contrast  08/07/2014   CLINICAL DATA:  Blurry vision and dizziness when getting up to use bathroom.  EXAM: CT HEAD WITHOUT CONTRAST  TECHNIQUE: Contiguous axial images were obtained from the base of the skull through the vertex without intravenous contrast.  COMPARISON:  None.  FINDINGS: The ventricles and sulci are normal for age. No intraparenchymal hemorrhage, mass effect nor midline shift. Patchy supratentorial white matter hypodensities are less than expected for patient's age and though non-specific suggest sequelae of chronic small vessel ischemic disease. No acute large vascular territory infarcts.  No abnormal extra-axial fluid collections. Basal cisterns are patent. Mild calcific atherosclerosis of  the carotid siphons.  No skull fracture. The included ocular globes and orbital contents are non-suspicious. Possible remote LEFT medial orbital blowout fracture versus prominent recesses the anterior ethmoid artery. The mastoid aircells and included paranasal sinuses are well-aerated.  IMPRESSION: No acute intracranial process ; normal noncontrast CT of the head for age.   Electronically Signed   By: Elon Alas   On: 08/07/2014 02:27    Scheduled Meds: .  stroke: mapping our early stages of recovery book   Does not apply Once  . aspirin EC  325 mg Oral Daily  . heparin  5,000 Units Subcutaneous 3 times per day  . mupirocin ointment   Topical TID  . pantoprazole  40 mg Oral Daily   Continuous Infusions:   Active Problems:   GERD (gastroesophageal reflux disease)   Arthritis   HTN (hypertension)   TIA (transient ischemic attack)    Adelene Idler PA-S  [facility] Pager [phone]. If 7PM-7AM, please contact night-coverage at [email or web address], password [facility] 08/07/2014, 9:15 AM  LOS: 0 days    Attending Patient seen and examined, agree with the above assessment and plan. Admitted with a myriad of symptoms including transient blurred vision, lightheadedness, dysarthria, and possible left-sided weakness-that have all resolved. During my rounds, completely asymptomatic. No prior history of syncope, VTE or cardiac issues. Some suspicion for TIA, hence undergoing workup. If workup negative will discharge on 325 mg of aspirin (previously on ASA 81 mg)  Nena Alexander MD

## 2014-08-07 NOTE — ED Notes (Signed)
Pt states that she woke up from sleep with blurred vision and shortness of breath around midnight. Pt also states that she had palpitations and a pain in her lower abdomen. Pt denies symptoms at this time. Pt also took 324mg  ASA prior to arrival.

## 2014-08-07 NOTE — Progress Notes (Signed)
OT Cancellation Note  Patient Details Name: Martha Chambers MRN: [id number] DOB: 1951-05-19   Cancelled Treatment:    Reason Eval/Treat Not Completed: OT screened, no needs identified, will sign off (pt reports all symptoms have resolved and she is functioning independently as PTA)  Janal Haak A 08/07/2014, 12:23 PM

## 2014-08-07 NOTE — Progress Notes (Signed)
  Echocardiogram 2D Echocardiogram has been performed.  Martha Chambers 08/07/2014, 11:57 AM

## 2014-08-08 DIAGNOSIS — K219 Gastro-esophageal reflux disease without esophagitis: Secondary | ICD-10-CM

## 2014-08-08 DIAGNOSIS — E785 Hyperlipidemia, unspecified: Secondary | ICD-10-CM

## 2014-08-08 MED ORDER — ASPIRIN EC 325 MG PO TBEC: 325.0000 mg | DELAYED_RELEASE_TABLET | Freq: Every day | ORAL | Status: DC

## 2014-08-08 MED ORDER — ATORVASTATIN CALCIUM 10 MG PO TABS: 10.0000 mg | ORAL_TABLET | Freq: Every day | ORAL | Status: DC

## 2014-08-08 NOTE — Discharge Instructions (Signed)
STROKE/TIA DISCHARGE INSTRUCTIONS SMOKING Cigarette smoking nearly doubles your risk of having a stroke & is the single most alterable risk factor  If you smoke or have smoked in the last 12 months, you are advised to quit smoking for your health.  Most of the excess cardiovascular risk related to smoking disappears within a year of stopping.  Ask you doctor about anti-smoking medications  [place] Quit Line: 1-800-QUIT NOW  Free Smoking Cessation Classes [phone]  CHOLESTEROL Know your levels; limit fat & cholesterol in your diet  Lipid Panel     Component Value Date/Time   CHOL 153 08/07/2014 0705   TRIG 147 08/07/2014 0705   HDL 35* 08/07/2014 0705   CHOLHDL 4.4 08/07/2014 0705   VLDL 29 08/07/2014 0705   LDLCALC 89 08/07/2014 0705      Many patients benefit from treatment even if their cholesterol is at goal.  Goal: Total Cholesterol (CHOL) less than 160  Goal:  Triglycerides (TRIG) less than 150  Goal:  HDL greater than 40  Goal:  LDL (LDLCALC) less than 100   BLOOD PRESSURE American Stroke Association blood pressure target is less that 120/80 mm/Hg  Your discharge blood pressure is:  BP: (!) 103/54 mmHg  Monitor your blood pressure  Limit your salt and alcohol intake  Many individuals will require more than one medication for high blood pressure  DIABETES (A1c is a blood sugar average for last 3 months) Goal HGBA1c is under 7% (HBGA1c is blood sugar average for last 3 months)  Diabetes: No known diagnosis of diabetes    Lab Results  Component Value Date   HGBA1C 6.1* 08/07/2014     Your HGBA1c can be lowered with medications, healthy diet, and exercise.  Check your blood sugar as directed by your physician  Call your physician if you experience unexplained or low blood sugars.  PHYSICAL ACTIVITY/REHABILITATION Goal is 30 minutes at least 4 days per week  Activity: Increase activity slowly,, No driving, and Walk with assistance, Return to work:   Activity  decreases your risk of heart attack and stroke and makes your heart stronger.  It helps control your weight and blood pressure; helps you relax and can improve your mood.  Participate in a regular exercise program.  Talk with your doctor about the best form of exercise for you (dancing, walking, swimming, cycling).  DIET/WEIGHT Goal is to maintain a healthy weight  Your discharge diet is: Diet Heart Diet - low sodium heart healthy Your height is:  Height: 5\' 5"  (165.1 cm) Your current weight is: Weight: 127.1 kg (280 lb 3.3 oz) Your Body Mass Index (BMI) is:  BMI (Calculated): 46.7  Following the type of diet specifically designed for you will help prevent another stroke.  Your goal Body Mass Index (BMI) is 19-24.  Healthy food habits can help reduce 3 risk factors for stroke:  High cholesterol, hypertension, and excess weight.  RESOURCES Stroke/Support Group:  Call [phone]   STROKE EDUCATION PROVIDED/REVIEWED AND GIVEN TO PATIENT Stroke warning signs and symptoms How to activate emergency medical system (call 911). Medications prescribed at discharge. Need for follow-up after discharge. Personal risk factors for stroke. Pneumonia vaccine given: No Flu vaccine given: No My questions have been answered, the writing is legible, and I understand these instructions.  I will adhere to these goals & educational materials that have been provided to me after my discharge from the hospital.

## 2014-08-08 NOTE — Discharge Summary (Signed)
Physician Discharge Summary  Martha Chambers OFB:[id number] DOB: March 18, 1951 DOA: 08/07/2014  PCP: Henrine Screws, MD  Admit date: 08/07/2014 Discharge date: 08/08/2014  Time spent: 60 minutes  Recommendations for Outpatient Follow-up:  1. Symptoms believed to be due to TIA. 2. LDL 89.  Started on 10mg  of Lipitor to achieve goal of LDL<70. Repeat lipid panel in 3 months  Discharge Diagnoses:  Active Problems:   GERD (gastroesophageal reflux disease)   Arthritis   HTN (hypertension)   TIA (transient ischemic attack)   Discharge Condition: Stable  Diet recommendation: Low sodium, heart healthy diet  Filed Weights   08/07/14 0554  Weight: 127.1 kg (280 lb 3.3 oz)    History of present illness:  63 yo female who awoke last night at midnight with lightheadness and blurred vision. She also noted weakness in her left leg, pain in her left arm, mild substernal chest pressure, and shortness of breath. This episode lasted approximately 45 minutes and resolved spontanteously. She has never experienced an episode like this before. She denies any ha, fever, chills, cough, nausea, vomiting, diarrhea, dysuria, hematochezia, hematuria, rash, or leg edema. Denies any personal or family hx of stroke or MI.  Hospital Course:  No recurrence of symptoms since admission.  No acute events during hospitalization.  Head CT and brain MRI/MRA show no acute abnormalities.  2D Echo and carotid duplex were negative.  Increased ASA to 325mg  daily and added Lipitor 20mg  to try and achieve LDL<70.   TIA: Other causes of symptoms ruled out.  Has not experienced any similar symptoms since admission.  Head CT and MRI of brain show no acute changes. 2D Echo and carotid duplex negative.  Troponin negative.  Blood glucose 114.  A1C 6.1.  LDL 89 and decreased HDL.  Continue ASA 325mg  on discharge.  Start Lipitor 10mg  on discharge.  Hypertension: Chronic problem.  Well controlled.  Resume Hyzarr and Metoprolol  at discharge.   GERD: Chronic problem.  Continue Omeprazole at discharge.   Arthiritis: Chronic problem.  Controlled.  Not on prescribed medications.         Procedures:  2D Echo  Carotid Duplex  Consultations:  None  Discharge Exam: Filed Vitals:   08/08/14 1108  BP: 103/54  Pulse: 76  Temp: 98.2 F (36.8 C)  Resp: 17    General: Well appearing, NAD, appears stated age  23:  Anicteic Sclera, MMM. Neck: no JVD, no masses  Cardiovascular: RRR, S1 S2 auscultated, no rubs, murmurs or gallops.   Respiratory: Clear to auscultation bilaterally with equal chest rise  Abdomen: Soft, nontender, nondistended, + bowel sounds  Extremities: warm dry without cyanosis clubbing or edema.  Neuro: AAOx3, cranial nerves grossly intact.  Skin: Without rashes exudates or nodules.   Psych: Normal affect and demeanor with intact judgement and insight   Current Discharge Medication List    START taking these medications   Details  atorvastatin (LIPITOR) 10 MG tablet Take 1 tablet (10 mg total) by mouth daily at 6 PM. Qty: 30 tablet, Refills: 0      CONTINUE these medications which have CHANGED   Details  aspirin EC 325 MG tablet Take 1 tablet (325 mg total) by mouth daily. Qty: 30 tablet, Refills: 0      CONTINUE these medications which have NOT CHANGED   Details  losartan-hydrochlorothiazide (HYZAAR) 100-25 MG per tablet Take 1 tablet by mouth daily.    metoprolol succinate (TOPROL-XL) 25 MG 24 hr tablet Take 25 mg by mouth daily.  omeprazole (PRILOSEC) 20 MG capsule Take 20 mg by mouth daily.      HYDROcodone-acetaminophen (NORCO/VICODIN) 5-325 MG per tablet 1 to 2 tabs every 4 to 6 hours as needed for pain. Qty: 20 tablet, Refills: 0    mupirocin ointment (BACTROBAN) 2 % Apply topically 3 (three) times daily. Qty: 22 g, Refills: 0      STOP taking these medications     doxycycline (VIBRA-TABS) 100 MG tablet      valsartan-hydrochlorothiazide (DIOVAN-HCT)  320-12.5 MG per tablet        Allergies  Allergen Reactions  . Ciprofloxacin Shortness Of Breath and Rash  . Sulfa Antibiotics Shortness Of Breath and Rash  . Tomato Shortness Of Breath and Swelling    Facial swelling  . Strawberry Swelling    Facial swelling  . Ceftin Rash  . Codeine Rash   Follow-up Information    Follow up with GATES,ROBERT NEVILL, MD In 1 week.   Specialty:  Internal Medicine   Contact information:   [address] [place] [address] [place] [address] [phone]        The results of significant diagnostics from this hospitalization (including imaging, microbiology, ancillary and laboratory) are listed below for reference.    Significant Diagnostic Studies: Ct Head Wo Contrast  08/07/2014   CLINICAL DATA:  Blurry vision and dizziness when getting up to use bathroom.  EXAM: CT HEAD WITHOUT CONTRAST  TECHNIQUE: Contiguous axial images were obtained from the base of the skull through the vertex without intravenous contrast.  COMPARISON:  None.  FINDINGS: The ventricles and sulci are normal for age. No intraparenchymal hemorrhage, mass effect nor midline shift. Patchy supratentorial white matter hypodensities are less than expected for patient's age and though non-specific suggest sequelae of chronic small vessel ischemic disease. No acute large vascular territory infarcts.  No abnormal extra-axial fluid collections. Basal cisterns are patent. Mild calcific atherosclerosis of the carotid siphons.  No skull fracture. The included ocular globes and orbital contents are non-suspicious. Possible remote LEFT medial orbital blowout fracture versus prominent recesses the anterior ethmoid artery. The mastoid aircells and included paranasal sinuses are well-aerated.  IMPRESSION: No acute intracranial process ; normal noncontrast CT of the head for age.   Electronically Signed   By: Elon Alas   On: 08/07/2014 02:27   Mr Brain Wo Contrast  08/07/2014   CLINICAL DATA:   63 year old female with acute onset dizziness and near syncope at midnight with left lower extremity weakness, blurred vision. Persistent mild difficulty swallowing. Initial encounter.  EXAM: MRI HEAD WITHOUT CONTRAST  MRA HEAD WITHOUT CONTRAST  TECHNIQUE: Multiplanar, multiecho pulse sequences of the brain and surrounding structures were obtained without intravenous contrast. Angiographic images of the head were obtained using MRA technique without contrast.  COMPARISON:  Head CT without contrast 0221 hr the same day.  FINDINGS: MRI HEAD FINDINGS  Partially empty sella configuration. Degenerative ligamentous hypertrophy at the odontoid process. Cerebral volume is within normal limits for age. No restricted diffusion to suggest acute infarction. No midline shift, mass effect, evidence of mass lesion, ventriculomegaly, extra-axial collection or acute intracranial hemorrhage. Major intracranial vascular flow voids are preserved, dominant distal left vertebral artery.  Scattered small mostly central and subcortical white matter T2 and FLAIR hyperintensity, nonspecific. Extent is mild to moderate for age. No cortical encephalomalacia identified. Deep gray matter nuclei, brainstem and cerebellum are within normal limits for age. Visible internal auditory structures appear normal.  Perhaps mild flattening of the posterior globe on  the left. Otherwise negative orbits soft tissues. Visualized paranasal sinuses and mastoids are clear. Normal bone marrow signal.  Scalp soft tissues remarkable for a 17 mm soft tissue nodule in the left neck near the left parotid gland showing conspicuous increased diffusion signal (series 8, image 20). Similar small adjacent lesion which does appear to be within the substance of the left parotid gland.  MRA HEAD FINDINGS  Antegrade flow in the posterior circulation, dominant distal left vertebral artery. Normal left PICA a and bilateral AICA origins. No basilar stenosis. Fetal type right PCA  origin. SCA and left PCA origins within normal limits. Both posterior communicating arteries are within normal limits. Bilateral PCA branches are within normal limits.  Antegrade flow in both ICA siphons. No siphon stenosis. Ophthalmic and posterior communicating artery origins are within normal limits. Normal carotid termini, MCA and ACA origins. Diminutive or absent anterior communicating artery. Visualized bilateral ACA branches are within normal limits. Visualized bilateral MCA branches are within normal limits.  IMPRESSION: 1.  No acute intracranial abnormality. 2. Partially empty sella configuration which can be a normal anatomic variant or associated with idiopathic intracranial hypertension (pseudotumor cerebri). 3.  Negative intracranial MRA. 4. Soft tissue nodules individually up to 18 mm in the left neck suspicious for primary parotid lesion(s). Recommend nonemergent (e.g. outpatient) follow-up neck CT with IV contrast.   Electronically Signed   By: Lars Pinks M.D.   On: 08/07/2014 11:02   Mr Jodene Nam Head/brain Wo Cm  08/07/2014   CLINICAL DATA:  63 year old female with acute onset dizziness and near syncope at midnight with left lower extremity weakness, blurred vision. Persistent mild difficulty swallowing. Initial encounter.  EXAM: MRI HEAD WITHOUT CONTRAST  MRA HEAD WITHOUT CONTRAST  TECHNIQUE: Multiplanar, multiecho pulse sequences of the brain and surrounding structures were obtained without intravenous contrast. Angiographic images of the head were obtained using MRA technique without contrast.  COMPARISON:  Head CT without contrast 0221 hr the same day.  FINDINGS: MRI HEAD FINDINGS  Partially empty sella configuration. Degenerative ligamentous hypertrophy at the odontoid process. Cerebral volume is within normal limits for age. No restricted diffusion to suggest acute infarction. No midline shift, mass effect, evidence of mass lesion, ventriculomegaly, extra-axial collection or acute intracranial  hemorrhage. Major intracranial vascular flow voids are preserved, dominant distal left vertebral artery.  Scattered small mostly central and subcortical white matter T2 and FLAIR hyperintensity, nonspecific. Extent is mild to moderate for age. No cortical encephalomalacia identified. Deep gray matter nuclei, brainstem and cerebellum are within normal limits for age. Visible internal auditory structures appear normal.  Perhaps mild flattening of the posterior globe on the left. Otherwise negative orbits soft tissues. Visualized paranasal sinuses and mastoids are clear. Normal bone marrow signal.  Scalp soft tissues remarkable for a 17 mm soft tissue nodule in the left neck near the left parotid gland showing conspicuous increased diffusion signal (series 8, image 20). Similar small adjacent lesion which does appear to be within the substance of the left parotid gland.  MRA HEAD FINDINGS  Antegrade flow in the posterior circulation, dominant distal left vertebral artery. Normal left PICA a and bilateral AICA origins. No basilar stenosis. Fetal type right PCA origin. SCA and left PCA origins within normal limits. Both posterior communicating arteries are within normal limits. Bilateral PCA branches are within normal limits.  Antegrade flow in both ICA siphons. No siphon stenosis. Ophthalmic and posterior communicating artery origins are within normal limits. Normal carotid termini, MCA and ACA origins. Diminutive or  absent anterior communicating artery. Visualized bilateral ACA branches are within normal limits. Visualized bilateral MCA branches are within normal limits.  IMPRESSION: 1.  No acute intracranial abnormality. 2. Partially empty sella configuration which can be a normal anatomic variant or associated with idiopathic intracranial hypertension (pseudotumor cerebri). 3.  Negative intracranial MRA. 4. Soft tissue nodules individually up to 18 mm in the left neck suspicious for primary parotid lesion(s).  Recommend nonemergent (e.g. outpatient) follow-up neck CT with IV contrast.   Electronically Signed   By: Lars Pinks M.D.   On: 08/07/2014 11:02   Labs: Basic Metabolic Panel:  Recent Labs Lab 08/07/14 0200  NA 133*  K 4.0  CL 96  CO2 24  GLUCOSE 114*  BUN 13  CREATININE 0.67  CALCIUM 9.3   Liver Function Tests:  Recent Labs Lab 08/07/14 0200  AST 18  ALT 16  ALKPHOS 77  BILITOT 0.2*  PROT 7.6  ALBUMIN 3.6   CBC:  Recent Labs Lab 08/07/14 0200  WBC 5.5  NEUTROABS 3.1  HGB 12.2  HCT 37.2  MCV 88.6  PLT 254   Cardiac Enzymes:  Recent Labs Lab 08/07/14 0705 08/07/14 1115 08/07/14 2011  TROPONINI <0.30 <0.30 <0.30   CBG:  Recent Labs Lab 08/07/14 0159  GLUCAP 117*    Signed:  Monico Hoar PA-S [facility] 08/08/2014, 11:09 AM  Attending Patient seen and examined, agree with the above assessment and plan. Suspected TIA, workup negative. Stable for discharge on 325 mg of aspirin and Lipitor. Rest of her medications are essentially unchanged. No further recurrence of symptoms, she is stable for discharge.  Nena Alexander MD

## 2014-08-08 NOTE — Progress Notes (Signed)
*  PRELIMINARY RESULTS* Vascular Ultrasound Carotid Duplex (Doppler) has been completed.  Preliminary findings: Bilateral:  1-39% ICA stenosis.  Vertebral artery flow is antegrade.     Landry Mellow, RDMS, RVT  08/08/2014, 9:50 AM

## 2014-08-08 NOTE — Progress Notes (Signed)
Discharge instructions reviewed and discussed with patient. She states she understands instructions and medications. IV removed. Patient will leave via wheelchair with husband to return to her home.

## 2014-09-02 ENCOUNTER — Other Ambulatory Visit: Payer: Self-pay | Admitting: Internal Medicine

## 2014-09-23 ENCOUNTER — Other Ambulatory Visit: Payer: Self-pay | Admitting: Gastroenterology

## 2014-09-23 DIAGNOSIS — K219 Gastro-esophageal reflux disease without esophagitis: Secondary | ICD-10-CM | POA: Diagnosis not present

## 2014-09-23 DIAGNOSIS — K5730 Diverticulosis of large intestine without perforation or abscess without bleeding: Secondary | ICD-10-CM | POA: Diagnosis not present

## 2014-09-23 DIAGNOSIS — R1033 Periumbilical pain: Secondary | ICD-10-CM | POA: Diagnosis not present

## 2014-09-24 ENCOUNTER — Other Ambulatory Visit: Payer: Self-pay | Admitting: Gastroenterology

## 2014-09-24 DIAGNOSIS — R1012 Left upper quadrant pain: Secondary | ICD-10-CM

## 2014-09-26 ENCOUNTER — Ambulatory Visit
Admission: RE | Admit: 2014-09-26 | Discharge: 2014-09-26 | Disposition: A | Discharge status: Home / Self Care | Payer: Commercial Managed Care - HMO | Source: Ambulatory Visit | Attending: Gastroenterology | Admitting: Gastroenterology

## 2014-09-26 DIAGNOSIS — K429 Umbilical hernia without obstruction or gangrene: Secondary | ICD-10-CM | POA: Diagnosis not present

## 2014-09-26 DIAGNOSIS — Z9049 Acquired absence of other specified parts of digestive tract: Secondary | ICD-10-CM | POA: Diagnosis not present

## 2014-09-26 DIAGNOSIS — Z90710 Acquired absence of both cervix and uterus: Secondary | ICD-10-CM | POA: Diagnosis not present

## 2014-09-26 DIAGNOSIS — R1012 Left upper quadrant pain: Secondary | ICD-10-CM

## 2014-09-26 MED ORDER — IOHEXOL 300 MG/ML  SOLN
125.0000 mL | Freq: Once | INTRAMUSCULAR | Status: AC | PRN
  Administered 2014-09-26: 125 mL via INTRAVENOUS

## 2014-11-25 DIAGNOSIS — R102 Pelvic and perineal pain: Secondary | ICD-10-CM | POA: Diagnosis not present

## 2014-11-25 DIAGNOSIS — M545 Low back pain: Secondary | ICD-10-CM | POA: Diagnosis not present

## 2014-11-27 ENCOUNTER — Other Ambulatory Visit: Payer: Self-pay | Admitting: Internal Medicine

## 2014-11-27 DIAGNOSIS — M549 Dorsalgia, unspecified: Secondary | ICD-10-CM

## 2014-11-27 DIAGNOSIS — R1084 Generalized abdominal pain: Secondary | ICD-10-CM

## 2014-12-02 ENCOUNTER — Ambulatory Visit
Admission: RE | Admit: 2014-12-02 | Discharge: 2014-12-02 | Disposition: A | Discharge status: Home / Self Care | Payer: Commercial Managed Care - HMO | Source: Ambulatory Visit | Attending: Internal Medicine | Admitting: Internal Medicine

## 2014-12-02 DIAGNOSIS — Z90710 Acquired absence of both cervix and uterus: Secondary | ICD-10-CM | POA: Diagnosis not present

## 2014-12-02 DIAGNOSIS — M549 Dorsalgia, unspecified: Secondary | ICD-10-CM

## 2014-12-02 DIAGNOSIS — Z9049 Acquired absence of other specified parts of digestive tract: Secondary | ICD-10-CM | POA: Diagnosis not present

## 2014-12-02 DIAGNOSIS — R1084 Generalized abdominal pain: Secondary | ICD-10-CM

## 2014-12-02 DIAGNOSIS — M545 Low back pain: Secondary | ICD-10-CM | POA: Diagnosis not present

## 2014-12-18 DIAGNOSIS — Z79899 Other long term (current) drug therapy: Secondary | ICD-10-CM | POA: Diagnosis not present

## 2014-12-18 DIAGNOSIS — K30 Functional dyspepsia: Secondary | ICD-10-CM | POA: Diagnosis not present

## 2014-12-18 DIAGNOSIS — E559 Vitamin D deficiency, unspecified: Secondary | ICD-10-CM | POA: Diagnosis not present

## 2014-12-18 DIAGNOSIS — Z6841 Body Mass Index (BMI) 40.0 and over, adult: Secondary | ICD-10-CM | POA: Diagnosis not present

## 2014-12-18 DIAGNOSIS — G459 Transient cerebral ischemic attack, unspecified: Secondary | ICD-10-CM | POA: Diagnosis not present

## 2014-12-18 DIAGNOSIS — Z0001 Encounter for general adult medical examination with abnormal findings: Secondary | ICD-10-CM | POA: Diagnosis not present

## 2014-12-18 DIAGNOSIS — E6609 Other obesity due to excess calories: Secondary | ICD-10-CM | POA: Diagnosis not present

## 2014-12-18 DIAGNOSIS — I10 Essential (primary) hypertension: Secondary | ICD-10-CM | POA: Diagnosis not present

## 2015-02-25 DIAGNOSIS — J019 Acute sinusitis, unspecified: Secondary | ICD-10-CM | POA: Diagnosis not present

## 2015-02-25 DIAGNOSIS — R040 Epistaxis: Secondary | ICD-10-CM | POA: Diagnosis not present

## 2015-09-16 DIAGNOSIS — Z1231 Encounter for screening mammogram for malignant neoplasm of breast: Secondary | ICD-10-CM | POA: Diagnosis not present

## 2015-09-17 DIAGNOSIS — H5213 Myopia, bilateral: Secondary | ICD-10-CM | POA: Diagnosis not present

## 2015-09-17 DIAGNOSIS — H5210 Myopia, unspecified eye: Secondary | ICD-10-CM | POA: Diagnosis not present

## 2016-02-01 DIAGNOSIS — L0201 Cutaneous abscess of face: Secondary | ICD-10-CM | POA: Diagnosis not present

## 2016-02-05 DIAGNOSIS — L0201 Cutaneous abscess of face: Secondary | ICD-10-CM | POA: Diagnosis not present

## 2016-02-12 DIAGNOSIS — R42 Dizziness and giddiness: Secondary | ICD-10-CM | POA: Diagnosis not present

## 2016-03-09 DIAGNOSIS — Z0001 Encounter for general adult medical examination with abnormal findings: Secondary | ICD-10-CM | POA: Diagnosis not present

## 2016-03-09 DIAGNOSIS — G459 Transient cerebral ischemic attack, unspecified: Secondary | ICD-10-CM | POA: Diagnosis not present

## 2016-03-09 DIAGNOSIS — E559 Vitamin D deficiency, unspecified: Secondary | ICD-10-CM | POA: Diagnosis not present

## 2016-03-09 DIAGNOSIS — E6609 Other obesity due to excess calories: Secondary | ICD-10-CM | POA: Diagnosis not present

## 2016-03-09 DIAGNOSIS — I10 Essential (primary) hypertension: Secondary | ICD-10-CM | POA: Diagnosis not present

## 2016-03-09 DIAGNOSIS — Z23 Encounter for immunization: Secondary | ICD-10-CM | POA: Diagnosis not present

## 2016-03-09 DIAGNOSIS — Z79899 Other long term (current) drug therapy: Secondary | ICD-10-CM | POA: Diagnosis not present

## 2016-03-09 DIAGNOSIS — K30 Functional dyspepsia: Secondary | ICD-10-CM | POA: Diagnosis not present

## 2016-03-09 DIAGNOSIS — L0201 Cutaneous abscess of face: Secondary | ICD-10-CM | POA: Diagnosis not present

## 2016-03-09 DIAGNOSIS — Z6841 Body Mass Index (BMI) 40.0 and over, adult: Secondary | ICD-10-CM | POA: Diagnosis not present

## 2016-03-28 DIAGNOSIS — S0990XA Unspecified injury of head, initial encounter: Secondary | ICD-10-CM | POA: Diagnosis not present

## 2016-03-28 DIAGNOSIS — T300 Burn of unspecified body region, unspecified degree: Secondary | ICD-10-CM | POA: Diagnosis not present

## 2016-04-13 DIAGNOSIS — Z780 Asymptomatic menopausal state: Secondary | ICD-10-CM | POA: Diagnosis not present

## 2016-04-13 DIAGNOSIS — Z13820 Encounter for screening for osteoporosis: Secondary | ICD-10-CM | POA: Diagnosis not present

## 2016-09-15 DIAGNOSIS — Z1231 Encounter for screening mammogram for malignant neoplasm of breast: Secondary | ICD-10-CM | POA: Diagnosis not present

## 2016-10-18 ENCOUNTER — Other Ambulatory Visit: Payer: Self-pay | Admitting: Gastroenterology

## 2016-10-18 DIAGNOSIS — R635 Abnormal weight gain: Secondary | ICD-10-CM | POA: Diagnosis not present

## 2016-10-18 DIAGNOSIS — R1013 Epigastric pain: Secondary | ICD-10-CM | POA: Diagnosis not present

## 2016-10-18 DIAGNOSIS — K5730 Diverticulosis of large intestine without perforation or abscess without bleeding: Secondary | ICD-10-CM | POA: Diagnosis not present

## 2016-10-18 DIAGNOSIS — R194 Change in bowel habit: Secondary | ICD-10-CM | POA: Diagnosis not present

## 2016-10-18 DIAGNOSIS — K219 Gastro-esophageal reflux disease without esophagitis: Secondary | ICD-10-CM | POA: Diagnosis not present

## 2016-10-18 DIAGNOSIS — R110 Nausea: Secondary | ICD-10-CM | POA: Diagnosis not present

## 2016-10-19 ENCOUNTER — Inpatient Hospital Stay
Admission: RE | Admit: 2016-10-19 | Discharge: 2016-10-19 | Disposition: A | Discharge status: Home / Self Care | Payer: Self-pay | Source: Ambulatory Visit | Attending: Gastroenterology | Admitting: Gastroenterology

## 2016-10-20 DIAGNOSIS — R300 Dysuria: Secondary | ICD-10-CM | POA: Diagnosis not present

## 2016-10-24 ENCOUNTER — Inpatient Hospital Stay
Admission: RE | Admit: 2016-10-24 | Discharge: 2016-10-24 | Disposition: A | Discharge status: Home / Self Care | Payer: Self-pay | Source: Ambulatory Visit | Attending: Gastroenterology | Admitting: Gastroenterology

## 2017-01-02 IMAGING — US US ABDOMEN COMPLETE
1 series · 14 of 25 positions shown · non-contrast
Comparison: None.

CLINICAL DATA: Generalized abdominal pain.

EXAM:
ULTRASOUND ABDOMEN COMPLETE

[Series 1: us abdomen complete · 0.25mm/px · 14 of 79 slices shown]
[im 1/79]
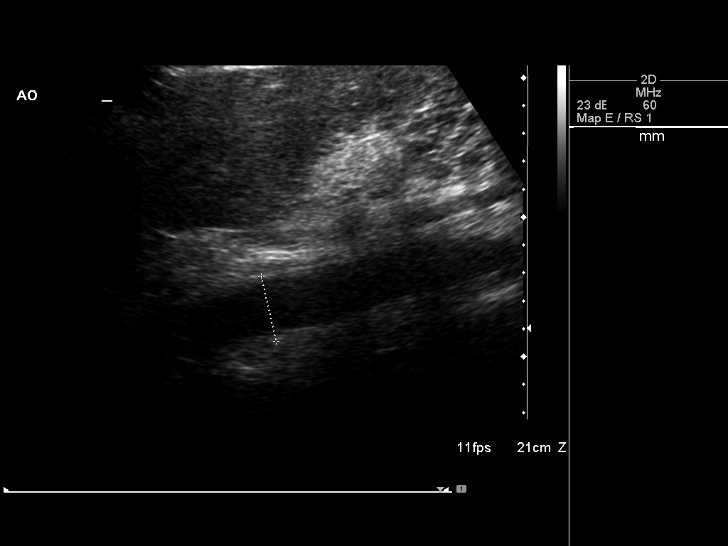
[im 7/79]
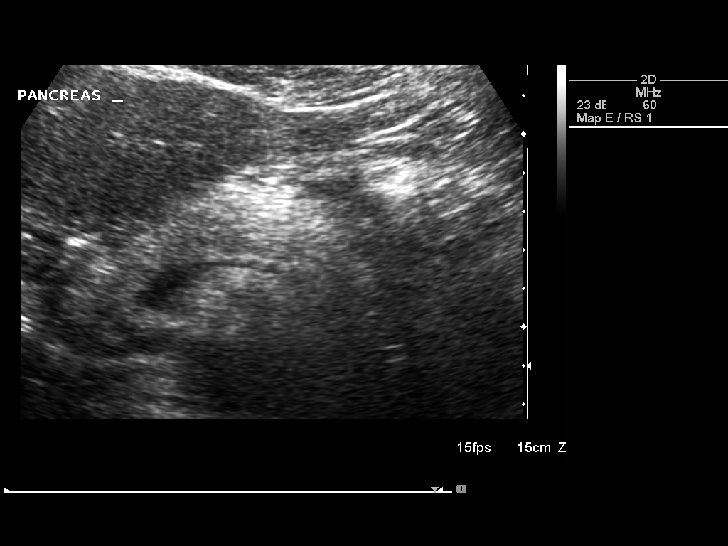
[im 14/79]
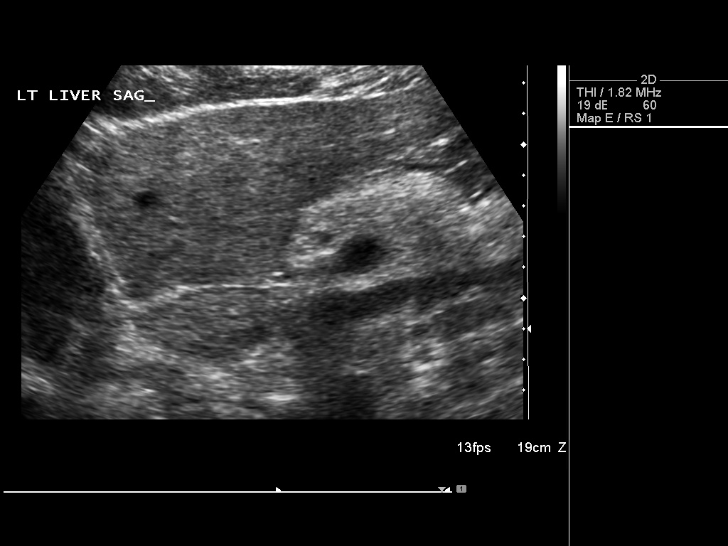
[im 20/79]
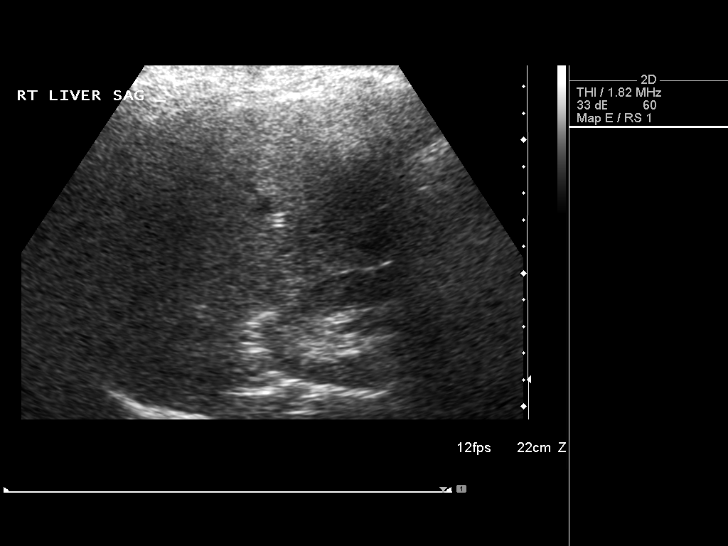
[im 27/79]
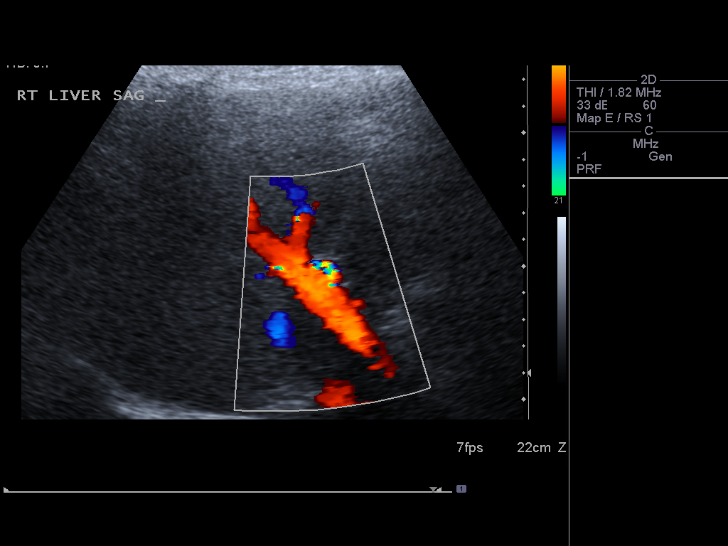
[im 30/79]
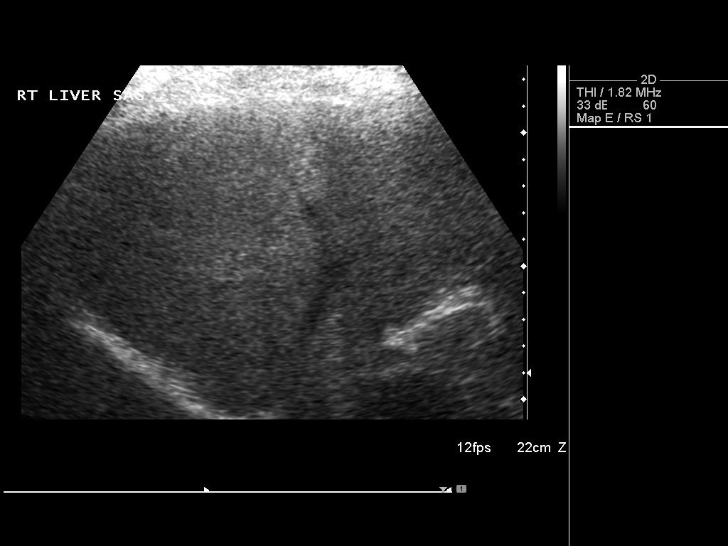
[im 36/79]
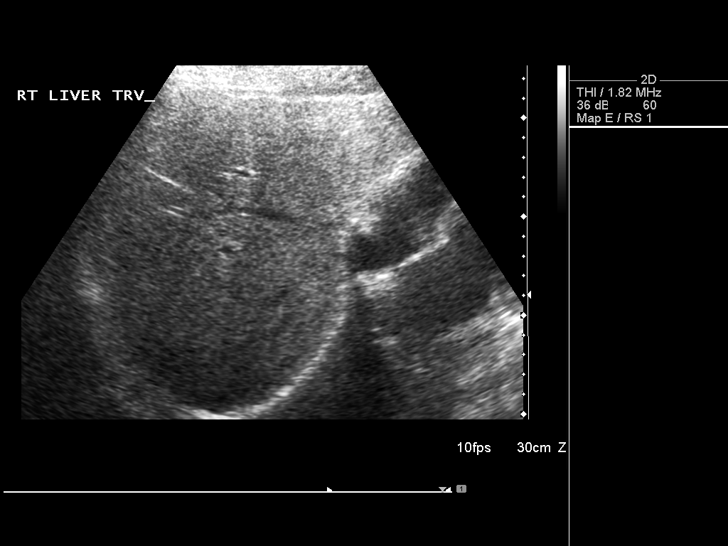
[im 43/79]
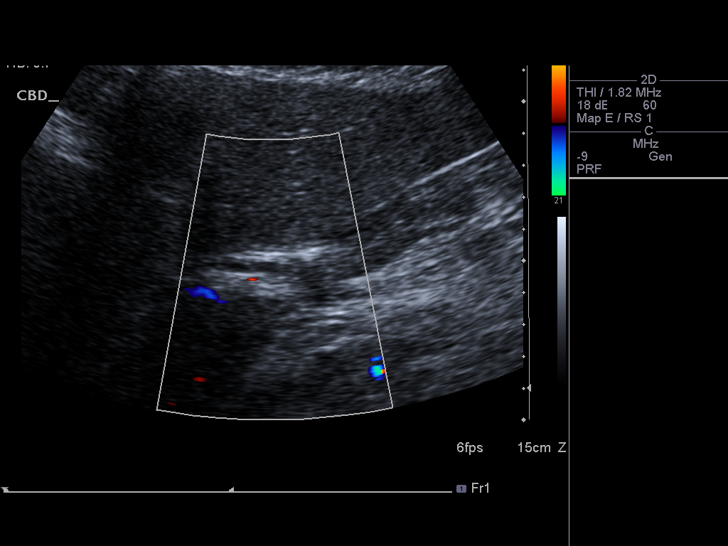
[im 49/79]
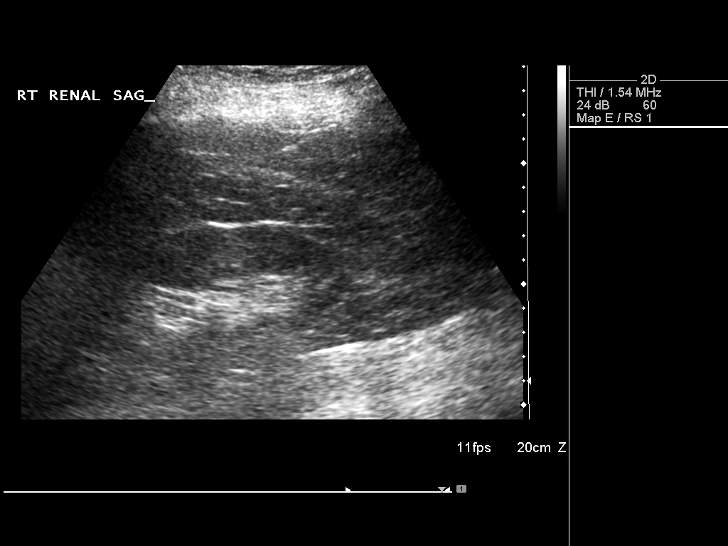
[im 53/79]
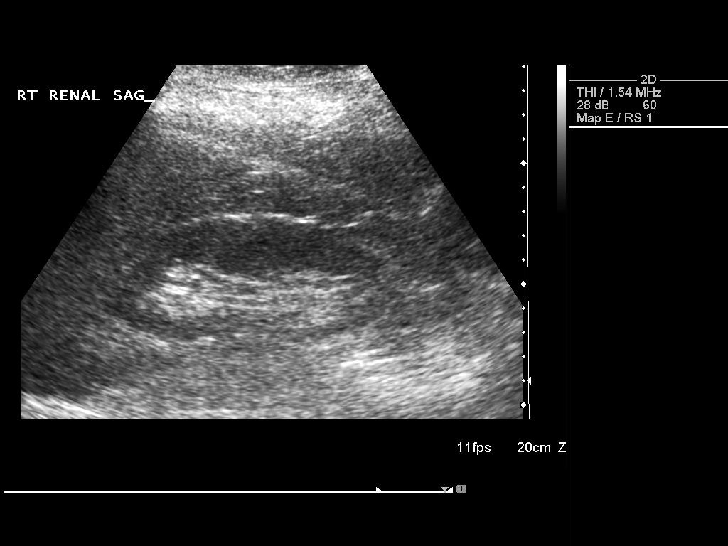
[im 59/79]
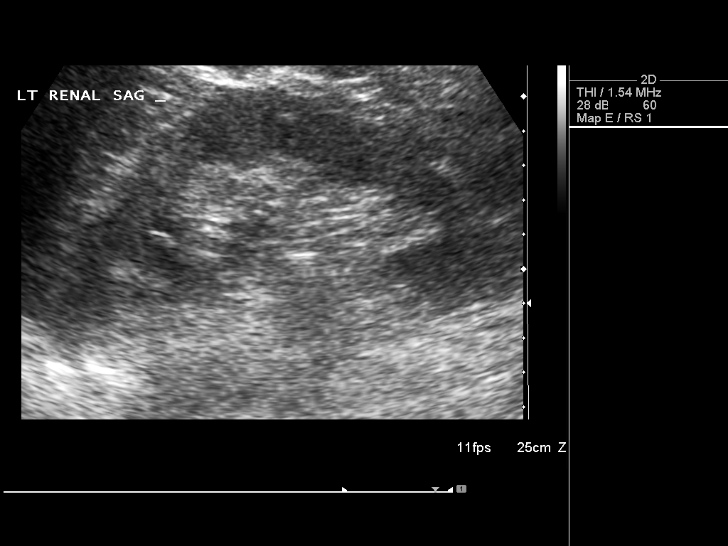
[im 66/79]
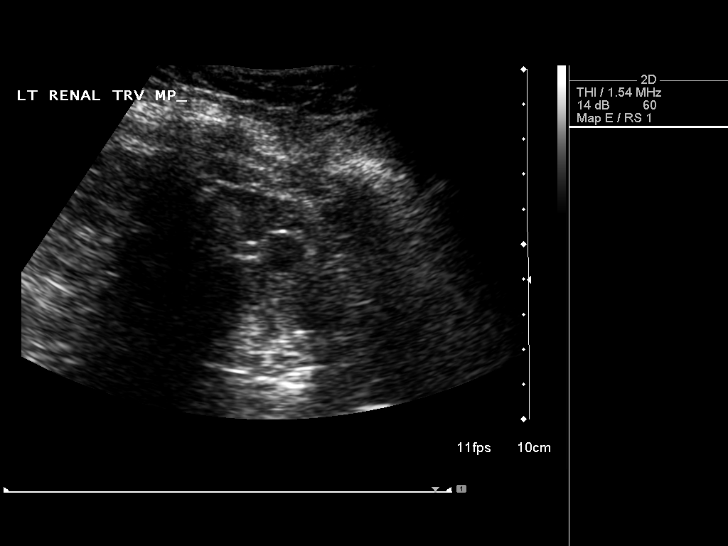
[im 72/79]
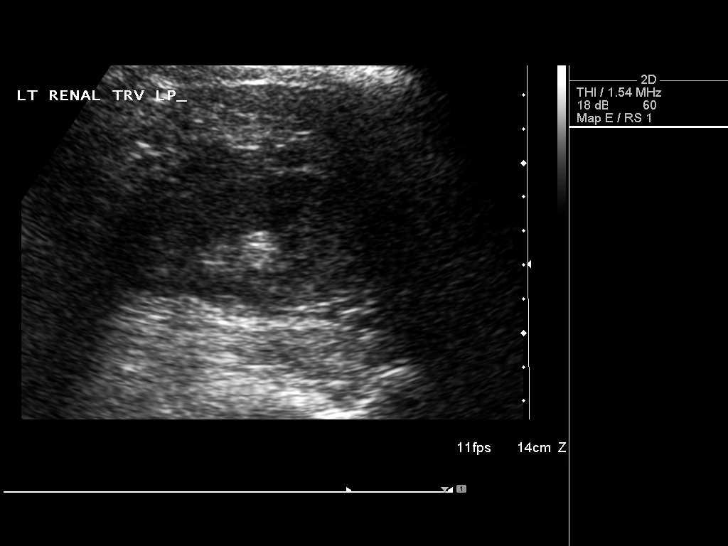
[im 79/79]
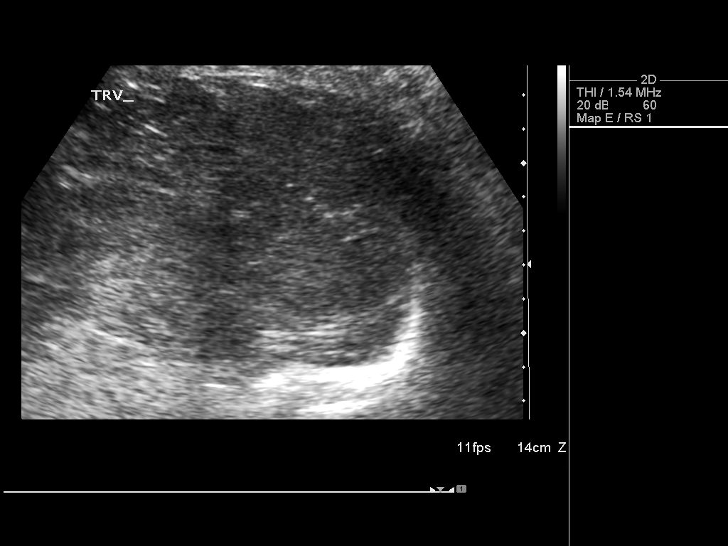

[14 of 25 positions shown; findings below may reference images not displayed]

FINDINGS: Gallbladder: Status post cholecystectomy.

Common bile duct: Diameter: 4.5 mm which is within normal limits.

Liver: No focal lesion identified. Within normal limits in
parenchymal echogenicity.

IVC: No abnormality visualized.

Pancreas: Visualized portion unremarkable.

Spleen: Size and appearance within normal limits.

Right Kidney: Length: 12.4 cm. Echogenicity within normal limits. No
mass or hydronephrosis visualized.

Left Kidney: Length: 11.1 cm. 1.5 cm cyst is seen in midpole.
Echogenicity within normal limits. No mass or hydronephrosis
visualized.

Abdominal aorta: No aneurysm visualized.

Other findings: None.
IMPRESSION: Status post cholecystectomy. No other significant abnormality seen
in the abdomen.

## 2017-03-15 DIAGNOSIS — J209 Acute bronchitis, unspecified: Secondary | ICD-10-CM | POA: Diagnosis not present

## 2017-04-14 DIAGNOSIS — Z0001 Encounter for general adult medical examination with abnormal findings: Secondary | ICD-10-CM | POA: Diagnosis not present

## 2017-04-14 DIAGNOSIS — Z1389 Encounter for screening for other disorder: Secondary | ICD-10-CM | POA: Diagnosis not present

## 2017-04-14 DIAGNOSIS — G459 Transient cerebral ischemic attack, unspecified: Secondary | ICD-10-CM | POA: Diagnosis not present

## 2017-04-14 DIAGNOSIS — E559 Vitamin D deficiency, unspecified: Secondary | ICD-10-CM | POA: Diagnosis not present

## 2017-04-14 DIAGNOSIS — I10 Essential (primary) hypertension: Secondary | ICD-10-CM | POA: Diagnosis not present

## 2017-04-14 DIAGNOSIS — K30 Functional dyspepsia: Secondary | ICD-10-CM | POA: Diagnosis not present

## 2017-04-14 DIAGNOSIS — Z6841 Body Mass Index (BMI) 40.0 and over, adult: Secondary | ICD-10-CM | POA: Diagnosis not present

## 2017-04-14 DIAGNOSIS — E6609 Other obesity due to excess calories: Secondary | ICD-10-CM | POA: Diagnosis not present

## 2017-04-14 DIAGNOSIS — Z79899 Other long term (current) drug therapy: Secondary | ICD-10-CM | POA: Diagnosis not present

## 2017-06-15 DIAGNOSIS — Z0100 Encounter for examination of eyes and vision without abnormal findings: Secondary | ICD-10-CM | POA: Diagnosis not present

## 2017-06-15 DIAGNOSIS — H25013 Cortical age-related cataract, bilateral: Secondary | ICD-10-CM | POA: Diagnosis not present

## 2017-09-20 ENCOUNTER — Other Ambulatory Visit: Payer: Self-pay | Admitting: Nurse Practitioner

## 2017-09-20 DIAGNOSIS — M79662 Pain in left lower leg: Secondary | ICD-10-CM | POA: Diagnosis not present

## 2017-09-20 DIAGNOSIS — R0602 Shortness of breath: Secondary | ICD-10-CM | POA: Diagnosis not present

## 2017-09-22 ENCOUNTER — Ambulatory Visit
Admission: RE | Admit: 2017-09-22 | Discharge: 2017-09-22 | Disposition: A | Discharge status: Home / Self Care | Payer: Medicare HMO | Source: Ambulatory Visit | Attending: Nurse Practitioner | Admitting: Nurse Practitioner

## 2017-09-22 DIAGNOSIS — M79605 Pain in left leg: Secondary | ICD-10-CM | POA: Diagnosis not present

## 2017-09-22 DIAGNOSIS — M79662 Pain in left lower leg: Secondary | ICD-10-CM

## 2017-09-29 DIAGNOSIS — Z1231 Encounter for screening mammogram for malignant neoplasm of breast: Secondary | ICD-10-CM | POA: Diagnosis not present

## 2017-10-05 DIAGNOSIS — R921 Mammographic calcification found on diagnostic imaging of breast: Secondary | ICD-10-CM | POA: Diagnosis not present

## 2017-10-10 DIAGNOSIS — E785 Hyperlipidemia, unspecified: Secondary | ICD-10-CM | POA: Diagnosis not present

## 2017-10-10 DIAGNOSIS — G459 Transient cerebral ischemic attack, unspecified: Secondary | ICD-10-CM | POA: Diagnosis not present

## 2017-10-10 DIAGNOSIS — I10 Essential (primary) hypertension: Secondary | ICD-10-CM | POA: Diagnosis not present

## 2017-10-11 ENCOUNTER — Other Ambulatory Visit: Payer: Self-pay | Admitting: Radiology

## 2017-10-11 DIAGNOSIS — R921 Mammographic calcification found on diagnostic imaging of breast: Secondary | ICD-10-CM | POA: Diagnosis not present

## 2017-10-11 DIAGNOSIS — N6012 Diffuse cystic mastopathy of left breast: Secondary | ICD-10-CM | POA: Diagnosis not present

## 2017-10-11 DIAGNOSIS — Z0000 Encounter for general adult medical examination without abnormal findings: Secondary | ICD-10-CM | POA: Diagnosis not present

## 2017-11-08 DIAGNOSIS — G459 Transient cerebral ischemic attack, unspecified: Secondary | ICD-10-CM | POA: Diagnosis not present

## 2017-11-08 DIAGNOSIS — E785 Hyperlipidemia, unspecified: Secondary | ICD-10-CM | POA: Diagnosis not present

## 2017-11-08 DIAGNOSIS — I10 Essential (primary) hypertension: Secondary | ICD-10-CM | POA: Diagnosis not present

## 2017-11-23 DIAGNOSIS — G4700 Insomnia, unspecified: Secondary | ICD-10-CM | POA: Diagnosis not present

## 2017-11-23 DIAGNOSIS — Z8673 Personal history of transient ischemic attack (TIA), and cerebral infarction without residual deficits: Secondary | ICD-10-CM | POA: Diagnosis not present

## 2017-11-23 DIAGNOSIS — I10 Essential (primary) hypertension: Secondary | ICD-10-CM | POA: Diagnosis not present

## 2017-11-23 DIAGNOSIS — R358 Other polyuria: Secondary | ICD-10-CM | POA: Diagnosis not present

## 2017-11-23 DIAGNOSIS — R7309 Other abnormal glucose: Secondary | ICD-10-CM | POA: Diagnosis not present

## 2017-11-23 DIAGNOSIS — R296 Repeated falls: Secondary | ICD-10-CM | POA: Diagnosis not present

## 2017-11-23 DIAGNOSIS — J9801 Acute bronchospasm: Secondary | ICD-10-CM | POA: Diagnosis not present

## 2017-11-23 DIAGNOSIS — E785 Hyperlipidemia, unspecified: Secondary | ICD-10-CM | POA: Diagnosis not present

## 2017-11-23 DIAGNOSIS — R42 Dizziness and giddiness: Secondary | ICD-10-CM | POA: Diagnosis not present

## 2017-11-23 DIAGNOSIS — G459 Transient cerebral ischemic attack, unspecified: Secondary | ICD-10-CM | POA: Diagnosis not present

## 2017-12-01 DIAGNOSIS — Z6841 Body Mass Index (BMI) 40.0 and over, adult: Secondary | ICD-10-CM | POA: Diagnosis not present

## 2017-12-01 DIAGNOSIS — I10 Essential (primary) hypertension: Secondary | ICD-10-CM | POA: Diagnosis not present

## 2017-12-01 DIAGNOSIS — R296 Repeated falls: Secondary | ICD-10-CM | POA: Diagnosis not present

## 2017-12-01 DIAGNOSIS — R42 Dizziness and giddiness: Secondary | ICD-10-CM | POA: Diagnosis not present

## 2017-12-27 DIAGNOSIS — M25511 Pain in right shoulder: Secondary | ICD-10-CM | POA: Diagnosis not present

## 2017-12-27 DIAGNOSIS — M542 Cervicalgia: Secondary | ICD-10-CM | POA: Diagnosis not present

## 2018-01-24 DIAGNOSIS — G459 Transient cerebral ischemic attack, unspecified: Secondary | ICD-10-CM | POA: Diagnosis not present

## 2018-01-24 DIAGNOSIS — E785 Hyperlipidemia, unspecified: Secondary | ICD-10-CM | POA: Diagnosis not present

## 2018-01-24 DIAGNOSIS — I10 Essential (primary) hypertension: Secondary | ICD-10-CM | POA: Diagnosis not present

## 2018-04-05 DIAGNOSIS — Z6841 Body Mass Index (BMI) 40.0 and over, adult: Secondary | ICD-10-CM | POA: Diagnosis not present

## 2018-04-05 DIAGNOSIS — I10 Essential (primary) hypertension: Secondary | ICD-10-CM | POA: Diagnosis not present

## 2018-04-24 DIAGNOSIS — E559 Vitamin D deficiency, unspecified: Secondary | ICD-10-CM | POA: Diagnosis not present

## 2018-04-24 DIAGNOSIS — G4710 Hypersomnia, unspecified: Secondary | ICD-10-CM | POA: Diagnosis not present

## 2018-04-24 DIAGNOSIS — M170 Bilateral primary osteoarthritis of knee: Secondary | ICD-10-CM | POA: Diagnosis not present

## 2018-04-24 DIAGNOSIS — I10 Essential (primary) hypertension: Secondary | ICD-10-CM | POA: Diagnosis not present

## 2018-04-24 DIAGNOSIS — Z79899 Other long term (current) drug therapy: Secondary | ICD-10-CM | POA: Diagnosis not present

## 2018-04-24 DIAGNOSIS — Z0000 Encounter for general adult medical examination without abnormal findings: Secondary | ICD-10-CM | POA: Diagnosis not present

## 2018-04-24 DIAGNOSIS — E785 Hyperlipidemia, unspecified: Secondary | ICD-10-CM | POA: Diagnosis not present

## 2018-04-24 DIAGNOSIS — K219 Gastro-esophageal reflux disease without esophagitis: Secondary | ICD-10-CM | POA: Diagnosis not present

## 2018-05-03 DIAGNOSIS — R921 Mammographic calcification found on diagnostic imaging of breast: Secondary | ICD-10-CM | POA: Diagnosis not present

## 2018-06-27 DIAGNOSIS — Z23 Encounter for immunization: Secondary | ICD-10-CM | POA: Diagnosis not present

## 2018-06-27 DIAGNOSIS — R300 Dysuria: Secondary | ICD-10-CM | POA: Diagnosis not present

## 2018-09-05 DIAGNOSIS — E7800 Pure hypercholesterolemia, unspecified: Secondary | ICD-10-CM | POA: Diagnosis not present

## 2018-09-05 DIAGNOSIS — H25013 Cortical age-related cataract, bilateral: Secondary | ICD-10-CM | POA: Diagnosis not present

## 2018-09-05 DIAGNOSIS — I10 Essential (primary) hypertension: Secondary | ICD-10-CM | POA: Diagnosis not present

## 2018-09-05 DIAGNOSIS — Z0100 Encounter for examination of eyes and vision without abnormal findings: Secondary | ICD-10-CM | POA: Diagnosis not present

## 2018-10-24 DIAGNOSIS — R7309 Other abnormal glucose: Secondary | ICD-10-CM | POA: Diagnosis not present

## 2018-10-24 DIAGNOSIS — E785 Hyperlipidemia, unspecified: Secondary | ICD-10-CM | POA: Diagnosis not present

## 2018-10-24 DIAGNOSIS — I10 Essential (primary) hypertension: Secondary | ICD-10-CM | POA: Diagnosis not present

## 2018-10-24 DIAGNOSIS — Z79899 Other long term (current) drug therapy: Secondary | ICD-10-CM | POA: Diagnosis not present

## 2018-10-24 DIAGNOSIS — Z8673 Personal history of transient ischemic attack (TIA), and cerebral infarction without residual deficits: Secondary | ICD-10-CM | POA: Diagnosis not present

## 2018-10-24 DIAGNOSIS — G4710 Hypersomnia, unspecified: Secondary | ICD-10-CM | POA: Diagnosis not present

## 2018-10-24 DIAGNOSIS — K219 Gastro-esophageal reflux disease without esophagitis: Secondary | ICD-10-CM | POA: Diagnosis not present

## 2018-10-24 DIAGNOSIS — F439 Reaction to severe stress, unspecified: Secondary | ICD-10-CM | POA: Diagnosis not present

## 2018-12-12 DIAGNOSIS — R0789 Other chest pain: Secondary | ICD-10-CM | POA: Diagnosis not present

## 2018-12-12 DIAGNOSIS — R42 Dizziness and giddiness: Secondary | ICD-10-CM | POA: Diagnosis not present

## 2019-03-18 DIAGNOSIS — E785 Hyperlipidemia, unspecified: Secondary | ICD-10-CM | POA: Diagnosis not present

## 2019-03-18 DIAGNOSIS — M170 Bilateral primary osteoarthritis of knee: Secondary | ICD-10-CM | POA: Diagnosis not present

## 2019-03-18 DIAGNOSIS — J4520 Mild intermittent asthma, uncomplicated: Secondary | ICD-10-CM | POA: Diagnosis not present

## 2019-03-18 DIAGNOSIS — I10 Essential (primary) hypertension: Secondary | ICD-10-CM | POA: Diagnosis not present

## 2019-03-18 DIAGNOSIS — G459 Transient cerebral ischemic attack, unspecified: Secondary | ICD-10-CM | POA: Diagnosis not present

## 2019-04-15 DIAGNOSIS — E785 Hyperlipidemia, unspecified: Secondary | ICD-10-CM | POA: Diagnosis not present

## 2019-04-15 DIAGNOSIS — G459 Transient cerebral ischemic attack, unspecified: Secondary | ICD-10-CM | POA: Diagnosis not present

## 2019-04-15 DIAGNOSIS — J4520 Mild intermittent asthma, uncomplicated: Secondary | ICD-10-CM | POA: Diagnosis not present

## 2019-04-15 DIAGNOSIS — M170 Bilateral primary osteoarthritis of knee: Secondary | ICD-10-CM | POA: Diagnosis not present

## 2019-04-15 DIAGNOSIS — I10 Essential (primary) hypertension: Secondary | ICD-10-CM | POA: Diagnosis not present

## 2019-04-16 DIAGNOSIS — R319 Hematuria, unspecified: Secondary | ICD-10-CM | POA: Diagnosis not present

## 2019-04-16 DIAGNOSIS — R300 Dysuria: Secondary | ICD-10-CM | POA: Diagnosis not present

## 2019-05-06 DIAGNOSIS — E785 Hyperlipidemia, unspecified: Secondary | ICD-10-CM | POA: Diagnosis not present

## 2019-05-06 DIAGNOSIS — G459 Transient cerebral ischemic attack, unspecified: Secondary | ICD-10-CM | POA: Diagnosis not present

## 2019-05-06 DIAGNOSIS — J4520 Mild intermittent asthma, uncomplicated: Secondary | ICD-10-CM | POA: Diagnosis not present

## 2019-05-06 DIAGNOSIS — I10 Essential (primary) hypertension: Secondary | ICD-10-CM | POA: Diagnosis not present

## 2019-05-06 DIAGNOSIS — M170 Bilateral primary osteoarthritis of knee: Secondary | ICD-10-CM | POA: Diagnosis not present

## 2019-07-16 DIAGNOSIS — I10 Essential (primary) hypertension: Secondary | ICD-10-CM | POA: Diagnosis not present

## 2019-07-16 DIAGNOSIS — G459 Transient cerebral ischemic attack, unspecified: Secondary | ICD-10-CM | POA: Diagnosis not present

## 2019-07-16 DIAGNOSIS — M170 Bilateral primary osteoarthritis of knee: Secondary | ICD-10-CM | POA: Diagnosis not present

## 2019-07-16 DIAGNOSIS — J4520 Mild intermittent asthma, uncomplicated: Secondary | ICD-10-CM | POA: Diagnosis not present

## 2019-07-16 DIAGNOSIS — E785 Hyperlipidemia, unspecified: Secondary | ICD-10-CM | POA: Diagnosis not present

## 2019-07-25 DIAGNOSIS — Z6841 Body Mass Index (BMI) 40.0 and over, adult: Secondary | ICD-10-CM | POA: Diagnosis not present

## 2019-07-25 DIAGNOSIS — I10 Essential (primary) hypertension: Secondary | ICD-10-CM | POA: Diagnosis not present

## 2019-07-25 DIAGNOSIS — Z23 Encounter for immunization: Secondary | ICD-10-CM | POA: Diagnosis not present

## 2019-07-25 DIAGNOSIS — E785 Hyperlipidemia, unspecified: Secondary | ICD-10-CM | POA: Diagnosis not present

## 2019-07-25 DIAGNOSIS — Z1389 Encounter for screening for other disorder: Secondary | ICD-10-CM | POA: Diagnosis not present

## 2019-07-25 DIAGNOSIS — G459 Transient cerebral ischemic attack, unspecified: Secondary | ICD-10-CM | POA: Diagnosis not present

## 2019-07-25 DIAGNOSIS — E559 Vitamin D deficiency, unspecified: Secondary | ICD-10-CM | POA: Diagnosis not present

## 2019-07-25 DIAGNOSIS — Z0001 Encounter for general adult medical examination with abnormal findings: Secondary | ICD-10-CM | POA: Diagnosis not present

## 2019-08-05 DIAGNOSIS — M170 Bilateral primary osteoarthritis of knee: Secondary | ICD-10-CM | POA: Diagnosis not present

## 2019-08-05 DIAGNOSIS — J4520 Mild intermittent asthma, uncomplicated: Secondary | ICD-10-CM | POA: Diagnosis not present

## 2019-08-05 DIAGNOSIS — I10 Essential (primary) hypertension: Secondary | ICD-10-CM | POA: Diagnosis not present

## 2019-08-05 DIAGNOSIS — E785 Hyperlipidemia, unspecified: Secondary | ICD-10-CM | POA: Diagnosis not present

## 2019-08-05 DIAGNOSIS — G459 Transient cerebral ischemic attack, unspecified: Secondary | ICD-10-CM | POA: Diagnosis not present

## 2019-08-08 DIAGNOSIS — Z1231 Encounter for screening mammogram for malignant neoplasm of breast: Secondary | ICD-10-CM | POA: Diagnosis not present

## 2019-08-16 DIAGNOSIS — Z20828 Contact with and (suspected) exposure to other viral communicable diseases: Secondary | ICD-10-CM | POA: Diagnosis not present

## 2019-09-10 DIAGNOSIS — E785 Hyperlipidemia, unspecified: Secondary | ICD-10-CM | POA: Diagnosis not present

## 2019-09-10 DIAGNOSIS — G459 Transient cerebral ischemic attack, unspecified: Secondary | ICD-10-CM | POA: Diagnosis not present

## 2019-09-10 DIAGNOSIS — J4520 Mild intermittent asthma, uncomplicated: Secondary | ICD-10-CM | POA: Diagnosis not present

## 2019-09-10 DIAGNOSIS — I10 Essential (primary) hypertension: Secondary | ICD-10-CM | POA: Diagnosis not present

## 2019-09-10 DIAGNOSIS — M170 Bilateral primary osteoarthritis of knee: Secondary | ICD-10-CM | POA: Diagnosis not present

## 2019-09-16 DIAGNOSIS — E785 Hyperlipidemia, unspecified: Secondary | ICD-10-CM | POA: Diagnosis not present

## 2019-09-16 DIAGNOSIS — G459 Transient cerebral ischemic attack, unspecified: Secondary | ICD-10-CM | POA: Diagnosis not present

## 2019-09-16 DIAGNOSIS — I10 Essential (primary) hypertension: Secondary | ICD-10-CM | POA: Diagnosis not present

## 2019-09-16 DIAGNOSIS — M170 Bilateral primary osteoarthritis of knee: Secondary | ICD-10-CM | POA: Diagnosis not present

## 2019-09-16 DIAGNOSIS — J4520 Mild intermittent asthma, uncomplicated: Secondary | ICD-10-CM | POA: Diagnosis not present

## 2019-09-24 DIAGNOSIS — R109 Unspecified abdominal pain: Secondary | ICD-10-CM | POA: Diagnosis not present

## 2019-09-24 DIAGNOSIS — K529 Noninfective gastroenteritis and colitis, unspecified: Secondary | ICD-10-CM | POA: Diagnosis not present

## 2019-09-24 DIAGNOSIS — Z1211 Encounter for screening for malignant neoplasm of colon: Secondary | ICD-10-CM | POA: Diagnosis not present

## 2019-10-10 DIAGNOSIS — Z1159 Encounter for screening for other viral diseases: Secondary | ICD-10-CM | POA: Diagnosis not present

## 2019-10-15 DIAGNOSIS — D122 Benign neoplasm of ascending colon: Secondary | ICD-10-CM | POA: Diagnosis not present

## 2019-10-15 DIAGNOSIS — Z1211 Encounter for screening for malignant neoplasm of colon: Secondary | ICD-10-CM | POA: Diagnosis not present

## 2019-10-16 DIAGNOSIS — E785 Hyperlipidemia, unspecified: Secondary | ICD-10-CM | POA: Diagnosis not present

## 2019-10-16 DIAGNOSIS — M170 Bilateral primary osteoarthritis of knee: Secondary | ICD-10-CM | POA: Diagnosis not present

## 2019-10-16 DIAGNOSIS — J4520 Mild intermittent asthma, uncomplicated: Secondary | ICD-10-CM | POA: Diagnosis not present

## 2019-10-16 DIAGNOSIS — G459 Transient cerebral ischemic attack, unspecified: Secondary | ICD-10-CM | POA: Diagnosis not present

## 2019-10-16 DIAGNOSIS — I10 Essential (primary) hypertension: Secondary | ICD-10-CM | POA: Diagnosis not present

## 2019-10-18 DIAGNOSIS — D122 Benign neoplasm of ascending colon: Secondary | ICD-10-CM | POA: Diagnosis not present

## 2019-10-31 ENCOUNTER — Encounter: Payer: Self-pay | Admitting: Podiatry

## 2019-10-31 ENCOUNTER — Ambulatory Visit (INDEPENDENT_AMBULATORY_CARE_PROVIDER_SITE_OTHER): Payer: Medicare HMO | Admitting: Podiatry

## 2019-10-31 ENCOUNTER — Other Ambulatory Visit: Payer: Self-pay

## 2019-10-31 DIAGNOSIS — L600 Ingrowing nail: Secondary | ICD-10-CM | POA: Diagnosis not present

## 2019-10-31 MED ORDER — NEOMYCIN-POLYMYXIN-HC 1 % OT SOLN: OTIC | 1 refills | Status: DC

## 2019-10-31 NOTE — Progress Notes (Signed)
Subjective:  Patient ID: Martha Chambers, female    DOB: 1950/11/25,  MRN: [id number] HPI Chief Complaint  Patient presents with  . Toe Pain    Hallux right - medial border, tender x 1 month, tried trimming, using betadine and neosporin  . New Patient (Initial Visit)    Est pt 69    69 y.o. female presents with the above complaint.   ROS: Denies fever chills nausea vomiting muscle aches pains calf pain back pain chest pain shortness of breath.  Past Medical History:  Diagnosis Date  . Arthritis   . GERD (gastroesophageal reflux disease)   . Hypertension    Past Surgical History:  Procedure Laterality Date  . ABDOMINAL HYSTERECTOMY    . CHOLECYSTECTOMY    . KNEE ARTHROPLASTY  07   rt  . TONSILLECTOMY    . TOTAL KNEE ARTHROPLASTY  09/21/2011   Procedure: TOTAL KNEE ARTHROPLASTY;  Surgeon: Ninetta Lights, MD;  Location: [facility];  Service: Orthopedics;  Laterality: Left;  120 MINUTES FOR THIS SURGERY    Current Outpatient Medications:  .  ALBUTEROL IN, Inhale into the lungs., Disp: , Rfl:  .  Multiple Vitamin (MULTIVITAMIN) capsule, Take 1 capsule by mouth daily., Disp: , Rfl:  .  aspirin EC 325 MG tablet, Take 1 tablet (325 mg total) by mouth daily., Disp: 30 tablet, Rfl: 0 .  atorvastatin (LIPITOR) 10 MG tablet, Take 1 tablet (10 mg total) by mouth daily at 6 PM., Disp: 30 tablet, Rfl: 0 .  BREO ELLIPTA 100-25 MCG/INH AEPB, , Disp: , Rfl:  .  colestipol (COLESTID) 1 g tablet, , Disp: , Rfl:  .  hydrochlorothiazide (HYDRODIURIL) 25 MG tablet, , Disp: , Rfl:  .  losartan (COZAAR) 100 MG tablet, , Disp: , Rfl:  .  metoprolol succinate (TOPROL-XL) 25 MG 24 hr tablet, Take 25 mg by mouth daily., Disp: , Rfl:  .  NEOMYCIN-POLYMYXIN-HYDROCORTISONE (CORTISPORIN) 1 % SOLN OTIC solution, Apply 1-2 drops to toe BID after soaking, Disp: 10 mL, Rfl: 1 .  rosuvastatin (CRESTOR) 5 MG tablet, , Disp: , Rfl:   Allergies  Allergen Reactions  . Ciprofloxacin Shortness Of Breath and Rash  .  Sulfa Antibiotics Shortness Of Breath and Rash  . Tomato Shortness Of Breath and Swelling    Facial swelling  . Strawberry Extract Swelling    Facial swelling  . Ceftin Rash  . Codeine Rash   Review of Systems Objective:  There were no vitals filed for this visit.  General: Well developed, nourished, in no acute distress, alert and oriented x3   Dermatological: Skin is warm, dry and supple bilateral. Nails x 10 are well maintained; remaining integument appears unremarkable at this time. There are no open sores, no preulcerative lesions, no rash or signs of infection present.  Vascular: Dorsalis Pedis artery and Posterior Tibial artery pedal pulses are 2/4 bilateral with immedate capillary fill time. Pedal hair growth present. No varicosities and no lower extremity edema present bilateral.   Neruologic: Grossly intact via light touch bilateral. Vibratory intact via tuning fork bilateral. Protective threshold with Semmes Wienstein monofilament intact to all pedal sites bilateral. Patellar and Achilles deep tendon reflexes 2+ bilateral. No Babinski or clonus noted bilateral.   Musculoskeletal: No gross boney pedal deformities bilateral. No pain, crepitus, or limitation noted with foot and ankle range of motion bilateral. Muscular strength 5/5 in all groups tested bilateral.  Gait: Unassisted, Nonantalgic.    Radiographs:  None taken  Assessment & Plan:  Assessment: Ingrown toenail tibial border hallux right  Plan: Chemical matricectomy was performed today after local anesthesia was administered.  She tolerated procedure well removing the tibial border of the hallux nail plate.  Phenol was applied.  She tolerated procedure well without complications.  She was provided both oral and written home-going instructions for care and soaking of the toe as well as prescription for Cortisporin Otic to be applied twice daily after soaking.  I like to follow-up with her in a couple of weeks.      Max T. [place], [place]

## 2019-10-31 NOTE — Patient Instructions (Signed)

## 2019-11-06 ENCOUNTER — Telehealth: Payer: Self-pay | Admitting: Podiatry

## 2019-11-06 DIAGNOSIS — G459 Transient cerebral ischemic attack, unspecified: Secondary | ICD-10-CM | POA: Diagnosis not present

## 2019-11-06 DIAGNOSIS — E785 Hyperlipidemia, unspecified: Secondary | ICD-10-CM | POA: Diagnosis not present

## 2019-11-06 DIAGNOSIS — I10 Essential (primary) hypertension: Secondary | ICD-10-CM | POA: Diagnosis not present

## 2019-11-06 DIAGNOSIS — J4520 Mild intermittent asthma, uncomplicated: Secondary | ICD-10-CM | POA: Diagnosis not present

## 2019-11-06 DIAGNOSIS — M170 Bilateral primary osteoarthritis of knee: Secondary | ICD-10-CM | POA: Diagnosis not present

## 2019-11-06 NOTE — Telephone Encounter (Signed)
Hey can you please advise? Thanks Martha Chambers

## 2019-11-06 NOTE — Addendum Note (Signed)
Addended by: Clovis Riley E on: 11/06/2019 11:49 AM   Modules accepted: Orders

## 2019-11-06 NOTE — Telephone Encounter (Signed)
Called patient and let her know to use triple antibiotic ointment

## 2019-11-06 NOTE — Telephone Encounter (Signed)
Pt called she wanted a to know of any other suggestions for topical cream to go on her toe the one given is not covered on her Insurance and would like other suggestions

## 2019-11-12 ENCOUNTER — Ambulatory Visit: Payer: Medicare HMO | Admitting: Podiatry

## 2019-12-02 IMAGING — US US EXTREM LOW VENOUS*L*
1 series · 13 of 24 positions shown · non-contrast
Comparison: None.

CLINICAL DATA: Left leg pain for 1 week



[Series 1: us extrem low venous*left* · 0.08mm/px · 13 of 48 slices shown]
[im 1/48]
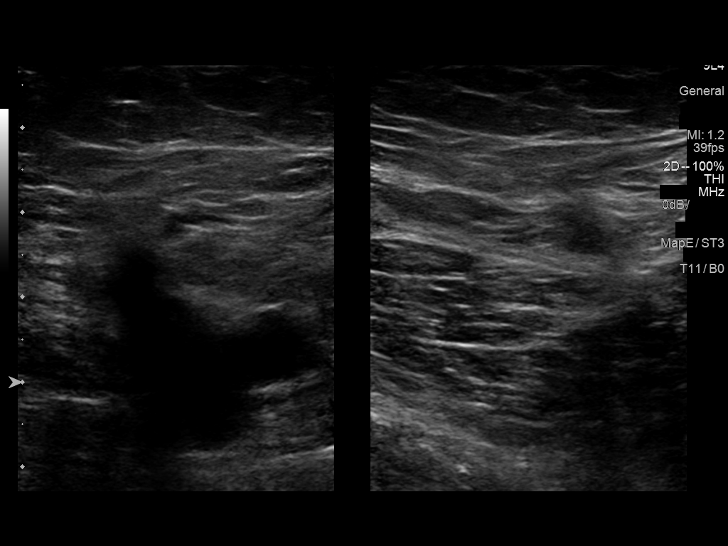
[im 5/48]
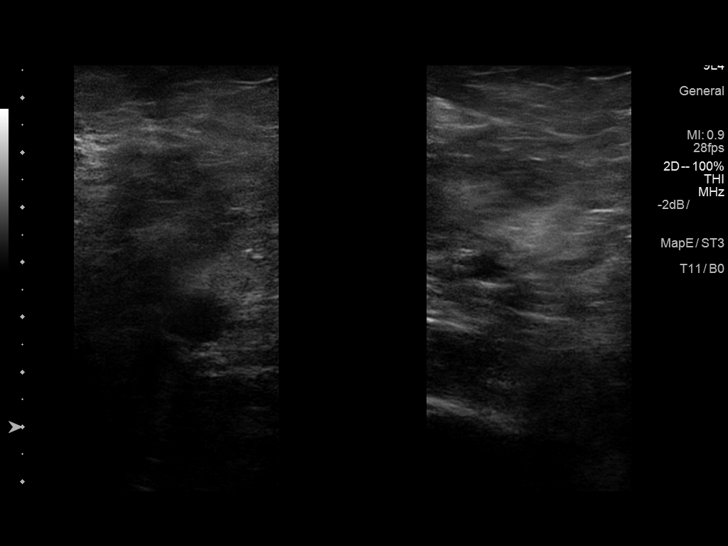
[im 9/48]
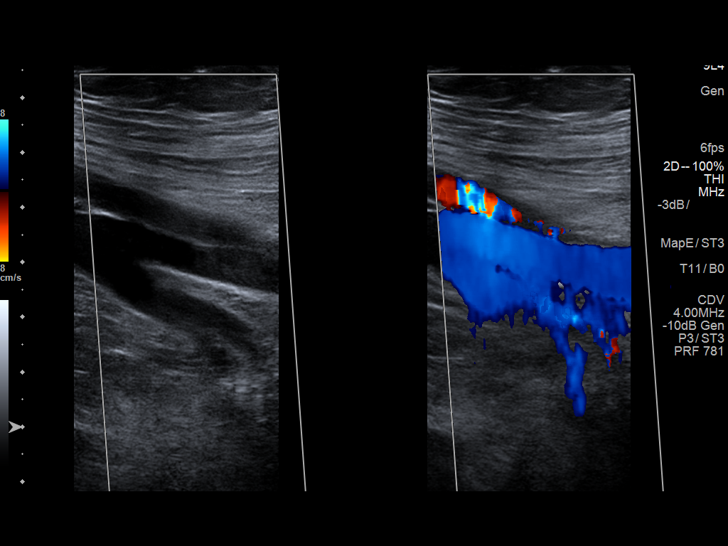
[im 13/48]
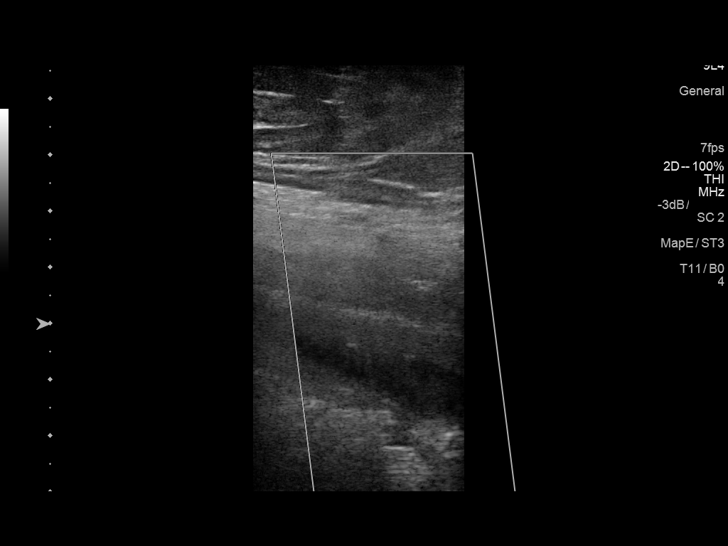
[im 17/48]
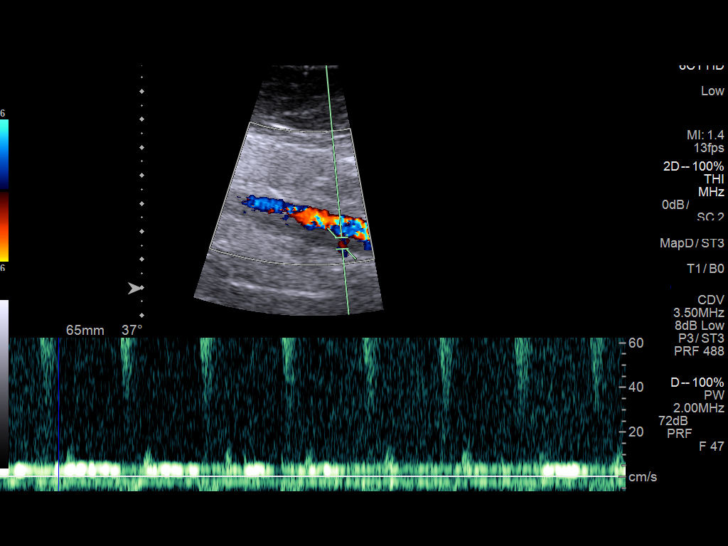
[im 21/48]
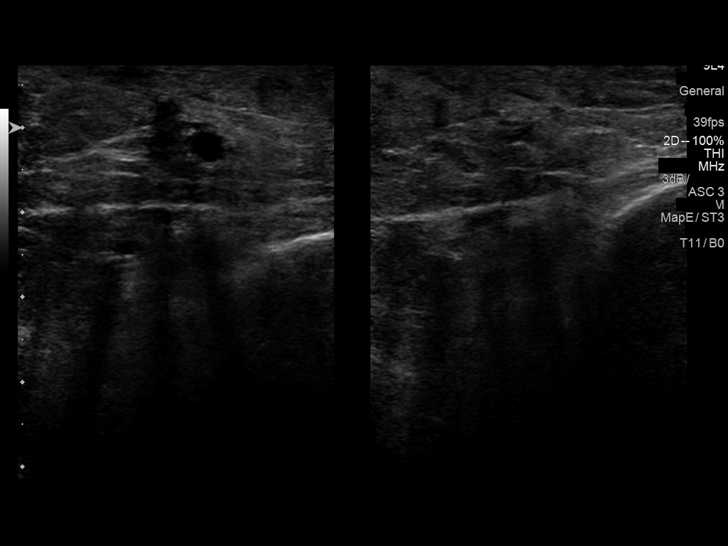
[im 25/48]
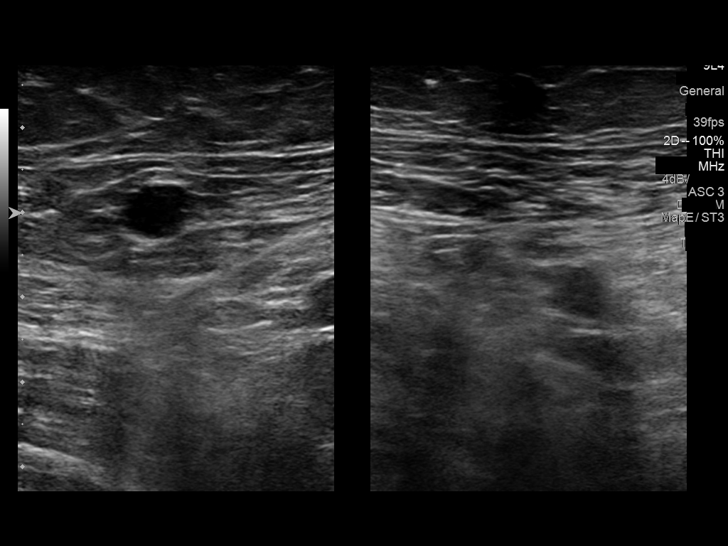
[im 27/48]
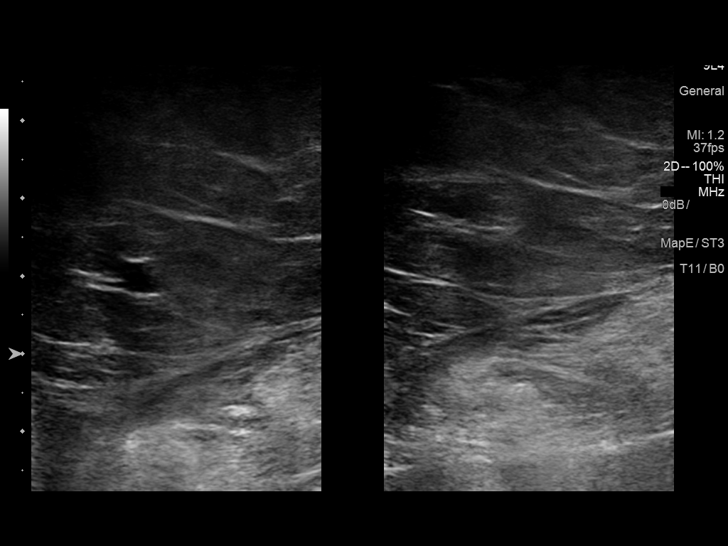
[im 31/48]
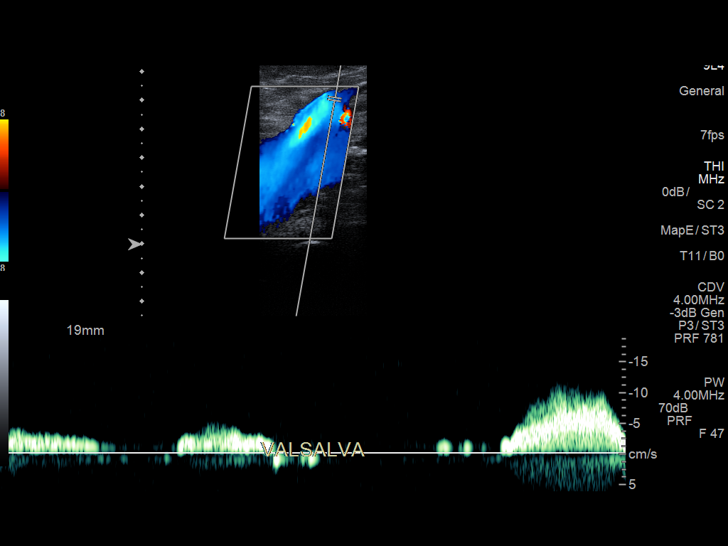
[im 35/48]
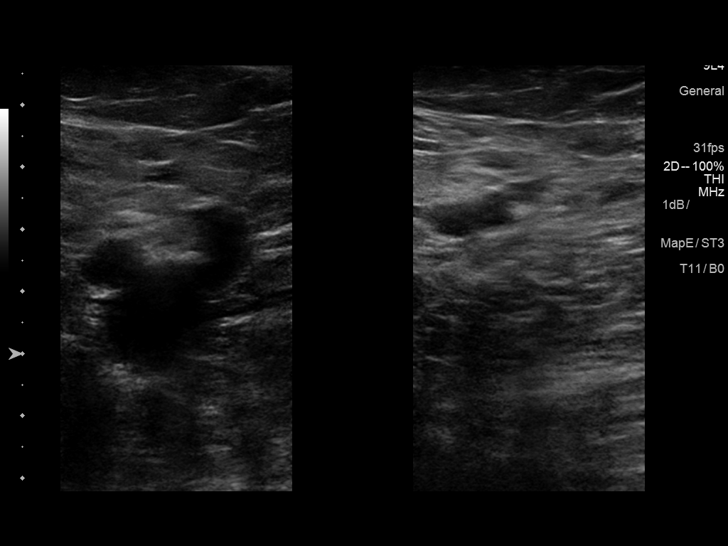
[im 39/48]
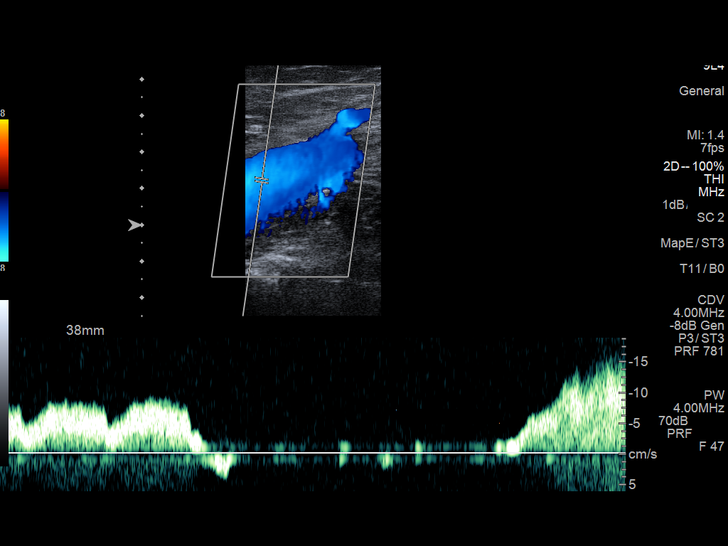
[im 43/48]
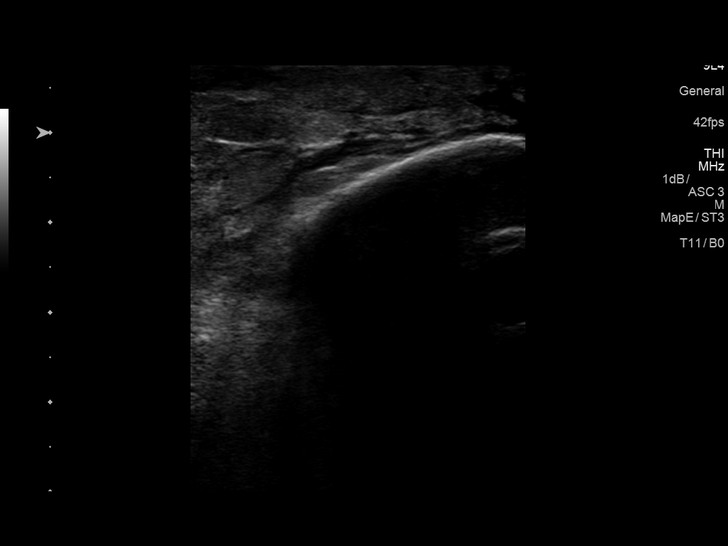
[im 48/48]
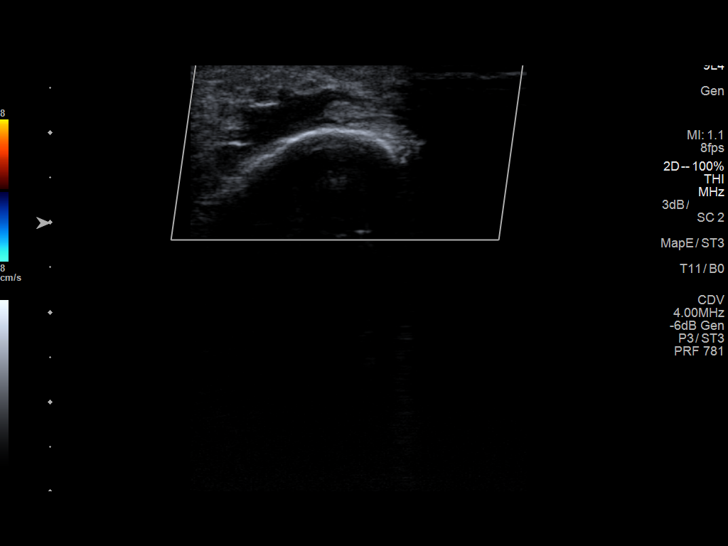

[13 of 24 positions shown; findings below may reference images not displayed]

FINDINGS: Contralateral Common Femoral Vein: Respiratory phasicity is normal
and symmetric with the symptomatic side. No evidence of thrombus.
Normal compressibility.

Common Femoral Vein: No evidence of thrombus. Normal
compressibility, respiratory phasicity and response to augmentation.

Saphenofemoral Junction: No evidence of thrombus. Normal
compressibility and flow on color Doppler imaging.

Profunda Femoral Vein: No evidence of thrombus. Normal
compressibility and flow on color Doppler imaging.

Femoral Vein: No evidence of thrombus. Normal compressibility,
respiratory phasicity and response to augmentation.

Popliteal Vein: No evidence of thrombus. Normal compressibility,
respiratory phasicity and response to augmentation.

Calf Veins: No evidence of thrombus. Normal compressibility and flow
on color Doppler imaging.

Superficial Great Saphenous Vein: No evidence of thrombus. Normal
compressibility.

Venous Reflux:  None.

Other Findings: Focal area decreased attenuation is noted along the
anterior aspect of the tibia in the area of clinical concern likely
representing a small hematoma.
IMPRESSION: No evidence of deep venous thrombosis.

## 2019-12-10 DIAGNOSIS — G459 Transient cerebral ischemic attack, unspecified: Secondary | ICD-10-CM | POA: Diagnosis not present

## 2019-12-10 DIAGNOSIS — I10 Essential (primary) hypertension: Secondary | ICD-10-CM | POA: Diagnosis not present

## 2019-12-10 DIAGNOSIS — E785 Hyperlipidemia, unspecified: Secondary | ICD-10-CM | POA: Diagnosis not present

## 2019-12-10 DIAGNOSIS — M170 Bilateral primary osteoarthritis of knee: Secondary | ICD-10-CM | POA: Diagnosis not present

## 2019-12-10 DIAGNOSIS — J4520 Mild intermittent asthma, uncomplicated: Secondary | ICD-10-CM | POA: Diagnosis not present

## 2019-12-23 DIAGNOSIS — H524 Presbyopia: Secondary | ICD-10-CM | POA: Diagnosis not present

## 2019-12-23 DIAGNOSIS — Z0100 Encounter for examination of eyes and vision without abnormal findings: Secondary | ICD-10-CM | POA: Diagnosis not present

## 2019-12-23 DIAGNOSIS — H1045 Other chronic allergic conjunctivitis: Secondary | ICD-10-CM | POA: Diagnosis not present

## 2019-12-23 DIAGNOSIS — H04123 Dry eye syndrome of bilateral lacrimal glands: Secondary | ICD-10-CM | POA: Diagnosis not present

## 2019-12-23 DIAGNOSIS — H25813 Combined forms of age-related cataract, bilateral: Secondary | ICD-10-CM | POA: Diagnosis not present

## 2020-03-03 DIAGNOSIS — R739 Hyperglycemia, unspecified: Secondary | ICD-10-CM | POA: Diagnosis not present

## 2020-03-03 DIAGNOSIS — E785 Hyperlipidemia, unspecified: Secondary | ICD-10-CM | POA: Diagnosis not present

## 2020-03-03 DIAGNOSIS — M170 Bilateral primary osteoarthritis of knee: Secondary | ICD-10-CM | POA: Diagnosis not present

## 2020-03-03 DIAGNOSIS — Z6841 Body Mass Index (BMI) 40.0 and over, adult: Secondary | ICD-10-CM | POA: Diagnosis not present

## 2020-03-03 DIAGNOSIS — I10 Essential (primary) hypertension: Secondary | ICD-10-CM | POA: Diagnosis not present

## 2020-03-03 DIAGNOSIS — J4520 Mild intermittent asthma, uncomplicated: Secondary | ICD-10-CM | POA: Diagnosis not present

## 2020-04-06 DIAGNOSIS — G459 Transient cerebral ischemic attack, unspecified: Secondary | ICD-10-CM | POA: Diagnosis not present

## 2020-04-06 DIAGNOSIS — E785 Hyperlipidemia, unspecified: Secondary | ICD-10-CM | POA: Diagnosis not present

## 2020-04-06 DIAGNOSIS — I10 Essential (primary) hypertension: Secondary | ICD-10-CM | POA: Diagnosis not present

## 2020-04-06 DIAGNOSIS — M170 Bilateral primary osteoarthritis of knee: Secondary | ICD-10-CM | POA: Diagnosis not present

## 2020-04-06 DIAGNOSIS — J4520 Mild intermittent asthma, uncomplicated: Secondary | ICD-10-CM | POA: Diagnosis not present

## 2020-05-08 ENCOUNTER — Ambulatory Visit
Admission: EM | Admit: 2020-05-08 | Discharge: 2020-05-08 | Disposition: A | Discharge status: Home / Self Care | Payer: Medicare HMO

## 2020-05-08 ENCOUNTER — Ambulatory Visit (INDEPENDENT_AMBULATORY_CARE_PROVIDER_SITE_OTHER): Payer: Medicare HMO

## 2020-05-08 ENCOUNTER — Other Ambulatory Visit: Payer: Self-pay

## 2020-05-08 DIAGNOSIS — W2209XA Striking against other stationary object, initial encounter: Secondary | ICD-10-CM | POA: Diagnosis not present

## 2020-05-08 DIAGNOSIS — R600 Localized edema: Secondary | ICD-10-CM | POA: Diagnosis not present

## 2020-05-08 DIAGNOSIS — M79674 Pain in right toe(s): Secondary | ICD-10-CM

## 2020-05-08 DIAGNOSIS — M7989 Other specified soft tissue disorders: Secondary | ICD-10-CM | POA: Diagnosis not present

## 2020-05-08 DIAGNOSIS — T148XXA Other injury of unspecified body region, initial encounter: Secondary | ICD-10-CM | POA: Diagnosis not present

## 2020-05-08 DIAGNOSIS — S99921A Unspecified injury of right foot, initial encounter: Secondary | ICD-10-CM | POA: Diagnosis not present

## 2020-05-08 NOTE — ED Provider Notes (Signed)
[facility]    CSN: [id number] Arrival date & time: 05/08/20  1625      History   Chief Complaint Chief Complaint  Patient presents with  . Foot Injury    HPI Martha Chambers is a 69 y.o. female.   69 year old female comes in for few day history of right great toe pain after injury. States hit toe on step while going up stairs. Has blood blister to the plantar surface of right great toe that is causing pain that is worsening.  Now with slight erythema to the area.     Past Medical History:  Diagnosis Date  . Arthritis   . GERD (gastroesophageal reflux disease)   . Hypertension     Patient Active Problem List   Diagnosis Date Noted  . HTN (hypertension) 08/07/2014  . TIA (transient ischemic attack) 08/07/2014  . GERD (gastroesophageal reflux disease)   . Arthritis     Past Surgical History:  Procedure Laterality Date  . ABDOMINAL HYSTERECTOMY    . CHOLECYSTECTOMY    . KNEE ARTHROPLASTY  07   rt  . TONSILLECTOMY    . TOTAL KNEE ARTHROPLASTY  09/21/2011   Procedure: TOTAL KNEE ARTHROPLASTY;  Surgeon: Ninetta Lights, MD;  Location: [facility];  Service: Orthopedics;  Laterality: Left;  120 MINUTES FOR THIS SURGERY    OB History   No obstetric history on file.      Home Medications    Prior to Admission medications   Medication Sig Start Date End Date Taking? Authorizing Provider  aspirin EC 81 MG tablet Take 81 mg by mouth daily. Swallow whole.   Yes [provider]  ALBUTEROL IN Inhale into the lungs.    [provider]  Adair Patter 100-25 MCG/INH AEPB  10/28/19   [provider]  hydrochlorothiazide (HYDRODIURIL) 25 MG tablet Take 12.5 mg by mouth.  08/28/19   [provider]  losartan (COZAAR) 100 MG tablet  08/28/19   [provider]  metoprolol succinate (TOPROL-XL) 25 MG 24 hr tablet Take 25 mg by mouth daily.    [provider]  Multiple Vitamin (MULTIVITAMIN) capsule Take 1 capsule by mouth  daily.    [provider]  rosuvastatin (CRESTOR) 5 MG tablet  07/31/19   [provider]  atorvastatin (LIPITOR) 10 MG tablet Take 1 tablet (10 mg total) by mouth daily at 6 PM. 08/08/14 05/08/20  Ghimire, Henreitta Leber, MD  colestipol (COLESTID) 1 g tablet  10/01/19 05/08/20  [provider]    Family History Family History  Problem Relation Age of Onset  . Anesthesia problems Neg Hx   . Hypotension Neg Hx   . Malignant hyperthermia Neg Hx   . Pseudochol deficiency Neg Hx     Social History Social History   Tobacco Use  . Smoking status: Never Smoker  . Smokeless tobacco: Never Used  Substance Use Topics  . Alcohol use: No  . Drug use: No     Allergies   Ciprofloxacin, Sulfa antibiotics, Tomato, Strawberry extract, Ceftin, and Codeine   Review of Systems Review of Systems  Reason unable to perform ROS: See HPI as above.     Physical Exam Triage Vital Signs ED Triage Vitals [05/08/20 1649]  Enc Vitals Group     BP 136/84     Pulse Rate 84     Resp 20     Temp 98.7 F (37.1 C)     Temp Source Oral  SpO2 92 %     Weight      Height      Head Circumference      Peak Flow      Pain Score      Pain Loc      Pain Edu?      Excl. in [place]?    No data found.  Updated Vital Signs BP 136/84 (BP Location: Left Arm)   Pulse 84   Temp 98.7 F (37.1 C) (Oral)   Resp 20   SpO2 92%   Physical Exam Constitutional:      General: She is not in acute distress.    Appearance: Normal appearance. She is well-developed. She is not toxic-appearing or diaphoretic.  HENT:     Head: Normocephalic and atraumatic.  Eyes:     Conjunctiva/sclera: Conjunctivae normal.     Pupils: Pupils are equal, round, and reactive to light.  Pulmonary:     Effort: Pulmonary effort is normal. No respiratory distress.  Musculoskeletal:     Cervical back: Normal range of motion and neck supple.     Comments: Right great toe swelling with blood blister to the plantar  surface. Mild erythema without warmth. Tender to palpation of great toe. Decreased ROM due to pain. NVI  Skin:    General: Skin is warm and dry.  Neurological:     Mental Status: She is alert and oriented to person, place, and time.      UC Treatments / Results  Labs (all labs ordered are listed, but only abnormal results are displayed) Labs Reviewed - No data to display  EKG   Radiology DG Toe Great Right  Result Date: 05/08/2020 CLINICAL DATA:  69 year old female with trauma to the right great toe. EXAM: RIGHT GREAT TOE COMPARISON:  None. FINDINGS: There is no acute fracture or dislocation. The bones are osteopenic. There is mild hallux valgus. There is diffuse subcutaneous edema. No radiopaque foreign object or soft tissue gas. IMPRESSION: No acute fracture or dislocation. Electronically Signed   By: Anner Crete M.D.   On: 05/08/2020 17:21    Procedures Incision and Drainage  Date/Time: 05/08/2020 6:38 PM Performed by: Ok Edwards, PA-C Authorized by: Ok Edwards, PA-C   Consent:    Consent obtained:  Verbal   Consent given by:  Patient   Risks discussed:  Bleeding, incomplete drainage, infection and pain   Alternatives discussed:  No treatment and alternative treatment Location:    Type:  Hematoma   Size:  1cm x 1cm   Location:  Lower extremity   Lower extremity location:  Toe   Toe location:  R big toe Pre-procedure details:    Skin preparation:  Chloraprep Anesthesia (see MAR for exact dosages):    Anesthesia method:  None Procedure type:    Complexity:  Simple Procedure details:    Needle aspiration: yes     Needle size:  18 G   Incision types:  Single straight   Incision depth:  Dermal   Scalpel blade:  11   Drainage:  Bloody   Drainage amount:  Moderate   Wound treatment:  Wound left open   Packing materials:  None Post-procedure details:    Patient tolerance of procedure:  Tolerated well, no immediate complications   (including critical care  time)  Medications Ordered in UC Medications - No data to display  Initial Impression / Assessment and Plan / UC Course  I have reviewed the triage vital signs and the  nursing notes.  Pertinent labs & imaging results that were available during my care of the patient were reviewed by me and considered in my medical decision making (see chart for details).    X-ray negative for fracture or dislocation.  Patient with improvement of symptoms after drainage of hematoma.  Discussed symptomatic management, wound care instructions.  Return precautions given.  Patient expresses understanding and agrees plan.  Final Clinical Impressions(s) / UC Diagnoses   Final diagnoses:  Great toe pain, right  Blood blister    ED Prescriptions    None     PDMP not reviewed this encounter.   Ok Edwards, PA-C 05/08/20 1840

## 2020-05-08 NOTE — Discharge Instructions (Signed)
Xray negative for fracture or dislocation. Continue ice compress, elevation. As discussed, when you can tolerate, massage blister to allow blood to keep flowing out. This can prevent pressure built up and help with pain. Monitor and make sure redness is resolving, if spreading with more pain, warmth, please let us know.

## 2020-05-08 NOTE — ED Triage Notes (Signed)
Pt states hit her rt foot on a concrete step on Wednesday.

## 2020-05-19 DIAGNOSIS — J4520 Mild intermittent asthma, uncomplicated: Secondary | ICD-10-CM | POA: Diagnosis not present

## 2020-05-19 DIAGNOSIS — E785 Hyperlipidemia, unspecified: Secondary | ICD-10-CM | POA: Diagnosis not present

## 2020-05-19 DIAGNOSIS — I10 Essential (primary) hypertension: Secondary | ICD-10-CM | POA: Diagnosis not present

## 2020-05-19 DIAGNOSIS — M170 Bilateral primary osteoarthritis of knee: Secondary | ICD-10-CM | POA: Diagnosis not present

## 2020-05-19 DIAGNOSIS — G459 Transient cerebral ischemic attack, unspecified: Secondary | ICD-10-CM | POA: Diagnosis not present

## 2020-06-02 DIAGNOSIS — Z23 Encounter for immunization: Secondary | ICD-10-CM | POA: Diagnosis not present

## 2020-06-02 DIAGNOSIS — M170 Bilateral primary osteoarthritis of knee: Secondary | ICD-10-CM | POA: Diagnosis not present

## 2020-06-02 DIAGNOSIS — J4520 Mild intermittent asthma, uncomplicated: Secondary | ICD-10-CM | POA: Diagnosis not present

## 2020-06-02 DIAGNOSIS — R7309 Other abnormal glucose: Secondary | ICD-10-CM | POA: Diagnosis not present

## 2020-06-02 DIAGNOSIS — I10 Essential (primary) hypertension: Secondary | ICD-10-CM | POA: Diagnosis not present

## 2020-06-02 DIAGNOSIS — Z6841 Body Mass Index (BMI) 40.0 and over, adult: Secondary | ICD-10-CM | POA: Diagnosis not present

## 2020-06-02 DIAGNOSIS — G459 Transient cerebral ischemic attack, unspecified: Secondary | ICD-10-CM | POA: Diagnosis not present

## 2020-06-02 DIAGNOSIS — E785 Hyperlipidemia, unspecified: Secondary | ICD-10-CM | POA: Diagnosis not present

## 2020-07-08 DIAGNOSIS — I10 Essential (primary) hypertension: Secondary | ICD-10-CM | POA: Diagnosis not present

## 2020-07-08 DIAGNOSIS — J4520 Mild intermittent asthma, uncomplicated: Secondary | ICD-10-CM | POA: Diagnosis not present

## 2020-07-08 DIAGNOSIS — E785 Hyperlipidemia, unspecified: Secondary | ICD-10-CM | POA: Diagnosis not present

## 2020-07-08 DIAGNOSIS — G459 Transient cerebral ischemic attack, unspecified: Secondary | ICD-10-CM | POA: Diagnosis not present

## 2020-07-08 DIAGNOSIS — M170 Bilateral primary osteoarthritis of knee: Secondary | ICD-10-CM | POA: Diagnosis not present

## 2020-08-10 DIAGNOSIS — Z1231 Encounter for screening mammogram for malignant neoplasm of breast: Secondary | ICD-10-CM | POA: Diagnosis not present

## 2020-08-12 DIAGNOSIS — J4520 Mild intermittent asthma, uncomplicated: Secondary | ICD-10-CM | POA: Diagnosis not present

## 2020-08-12 DIAGNOSIS — I10 Essential (primary) hypertension: Secondary | ICD-10-CM | POA: Diagnosis not present

## 2020-08-12 DIAGNOSIS — E559 Vitamin D deficiency, unspecified: Secondary | ICD-10-CM | POA: Diagnosis not present

## 2020-08-12 DIAGNOSIS — M170 Bilateral primary osteoarthritis of knee: Secondary | ICD-10-CM | POA: Diagnosis not present

## 2020-08-12 DIAGNOSIS — G459 Transient cerebral ischemic attack, unspecified: Secondary | ICD-10-CM | POA: Diagnosis not present

## 2020-08-12 DIAGNOSIS — Z1239 Encounter for other screening for malignant neoplasm of breast: Secondary | ICD-10-CM | POA: Diagnosis not present

## 2020-08-12 DIAGNOSIS — E785 Hyperlipidemia, unspecified: Secondary | ICD-10-CM | POA: Diagnosis not present

## 2020-08-12 DIAGNOSIS — R739 Hyperglycemia, unspecified: Secondary | ICD-10-CM | POA: Diagnosis not present

## 2020-08-12 DIAGNOSIS — Z1389 Encounter for screening for other disorder: Secondary | ICD-10-CM | POA: Diagnosis not present

## 2020-08-12 DIAGNOSIS — Z6841 Body Mass Index (BMI) 40.0 and over, adult: Secondary | ICD-10-CM | POA: Diagnosis not present

## 2020-08-12 DIAGNOSIS — Z0000 Encounter for general adult medical examination without abnormal findings: Secondary | ICD-10-CM | POA: Diagnosis not present

## 2020-09-10 DIAGNOSIS — J069 Acute upper respiratory infection, unspecified: Secondary | ICD-10-CM | POA: Diagnosis not present

## 2020-10-01 DIAGNOSIS — J4520 Mild intermittent asthma, uncomplicated: Secondary | ICD-10-CM | POA: Diagnosis not present

## 2020-10-01 DIAGNOSIS — I10 Essential (primary) hypertension: Secondary | ICD-10-CM | POA: Diagnosis not present

## 2020-10-01 DIAGNOSIS — E785 Hyperlipidemia, unspecified: Secondary | ICD-10-CM | POA: Diagnosis not present

## 2020-10-01 DIAGNOSIS — K219 Gastro-esophageal reflux disease without esophagitis: Secondary | ICD-10-CM | POA: Diagnosis not present

## 2020-10-01 DIAGNOSIS — G459 Transient cerebral ischemic attack, unspecified: Secondary | ICD-10-CM | POA: Diagnosis not present

## 2020-10-01 DIAGNOSIS — M170 Bilateral primary osteoarthritis of knee: Secondary | ICD-10-CM | POA: Diagnosis not present

## 2020-10-01 DIAGNOSIS — G4700 Insomnia, unspecified: Secondary | ICD-10-CM | POA: Diagnosis not present

## 2020-10-28 ENCOUNTER — Encounter: Payer: Self-pay | Admitting: Podiatry

## 2020-10-28 ENCOUNTER — Ambulatory Visit: Payer: Medicare HMO | Admitting: Podiatry

## 2020-10-28 ENCOUNTER — Other Ambulatory Visit: Payer: Self-pay

## 2020-10-28 DIAGNOSIS — M79674 Pain in right toe(s): Secondary | ICD-10-CM | POA: Diagnosis not present

## 2020-10-28 DIAGNOSIS — K5730 Diverticulosis of large intestine without perforation or abscess without bleeding: Secondary | ICD-10-CM | POA: Insufficient documentation

## 2020-10-28 DIAGNOSIS — R194 Change in bowel habit: Secondary | ICD-10-CM | POA: Insufficient documentation

## 2020-10-28 DIAGNOSIS — R110 Nausea: Secondary | ICD-10-CM | POA: Insufficient documentation

## 2020-10-28 DIAGNOSIS — R1033 Periumbilical pain: Secondary | ICD-10-CM | POA: Insufficient documentation

## 2020-10-28 DIAGNOSIS — B351 Tinea unguium: Secondary | ICD-10-CM | POA: Diagnosis not present

## 2020-10-28 DIAGNOSIS — R1031 Right lower quadrant pain: Secondary | ICD-10-CM | POA: Insufficient documentation

## 2020-10-28 DIAGNOSIS — R1310 Dysphagia, unspecified: Secondary | ICD-10-CM | POA: Insufficient documentation

## 2020-10-28 DIAGNOSIS — M79675 Pain in left toe(s): Secondary | ICD-10-CM

## 2020-10-28 DIAGNOSIS — R1013 Epigastric pain: Secondary | ICD-10-CM | POA: Insufficient documentation

## 2020-10-28 DIAGNOSIS — R635 Abnormal weight gain: Secondary | ICD-10-CM | POA: Insufficient documentation

## 2020-10-28 NOTE — Progress Notes (Signed)
This patient returns to the office for evaluation and treatment of long thick painful nails .  This patient is unable to trim her own nails since the patient cannot reach her feet.  Patient says the nails are painful walking and wearing his shoes.  He returns for preventive foot care services.  General Appearance  Alert, conversant and in no acute stress.  Vascular  Dorsalis pedis and posterior tibial  pulses are palpable  bilaterally.  Capillary return is within normal limits  bilaterally. Temperature is within normal limits  bilaterally.  Neurologic  Senn-Weinstein monofilament wire test within normal limits  bilaterally. Muscle power within normal limits bilaterally.  Nails Thick disfigured discolored nails with subungual debris  from hallux to fifth toes bilaterally. No evidence of bacterial infection or drainage bilaterally.  Orthopedic  No limitations of motion  feet .  No crepitus or effusions noted.  No bony pathology or digital deformities noted.  Skin  normotropic skin with no porokeratosis noted bilaterally.  No signs of infections or ulcers noted.     Onychomycosis  Pain in toes right foot  Pain in toes left foot  Debridement  of nails  1-5  B/L with a nail nipper.  Nails were then filed using a dremel tool with no incidents.    RTC 3 months  Gardiner Barefoot DPM

## 2020-11-25 DIAGNOSIS — F439 Reaction to severe stress, unspecified: Secondary | ICD-10-CM | POA: Diagnosis not present

## 2020-11-25 DIAGNOSIS — Z6841 Body Mass Index (BMI) 40.0 and over, adult: Secondary | ICD-10-CM | POA: Diagnosis not present

## 2020-11-25 DIAGNOSIS — R1011 Right upper quadrant pain: Secondary | ICD-10-CM | POA: Diagnosis not present

## 2020-11-25 DIAGNOSIS — M79602 Pain in left arm: Secondary | ICD-10-CM | POA: Diagnosis not present

## 2020-11-25 DIAGNOSIS — I10 Essential (primary) hypertension: Secondary | ICD-10-CM | POA: Diagnosis not present

## 2020-12-01 DIAGNOSIS — G4700 Insomnia, unspecified: Secondary | ICD-10-CM | POA: Diagnosis not present

## 2020-12-01 DIAGNOSIS — G459 Transient cerebral ischemic attack, unspecified: Secondary | ICD-10-CM | POA: Diagnosis not present

## 2020-12-01 DIAGNOSIS — M170 Bilateral primary osteoarthritis of knee: Secondary | ICD-10-CM | POA: Diagnosis not present

## 2020-12-01 DIAGNOSIS — K219 Gastro-esophageal reflux disease without esophagitis: Secondary | ICD-10-CM | POA: Diagnosis not present

## 2020-12-01 DIAGNOSIS — J4520 Mild intermittent asthma, uncomplicated: Secondary | ICD-10-CM | POA: Diagnosis not present

## 2020-12-01 DIAGNOSIS — I10 Essential (primary) hypertension: Secondary | ICD-10-CM | POA: Diagnosis not present

## 2020-12-01 DIAGNOSIS — E785 Hyperlipidemia, unspecified: Secondary | ICD-10-CM | POA: Diagnosis not present

## 2020-12-22 DIAGNOSIS — R0989 Other specified symptoms and signs involving the circulatory and respiratory systems: Secondary | ICD-10-CM | POA: Diagnosis not present

## 2020-12-22 DIAGNOSIS — F439 Reaction to severe stress, unspecified: Secondary | ICD-10-CM | POA: Diagnosis not present

## 2020-12-22 DIAGNOSIS — I10 Essential (primary) hypertension: Secondary | ICD-10-CM | POA: Diagnosis not present

## 2020-12-22 DIAGNOSIS — R1011 Right upper quadrant pain: Secondary | ICD-10-CM | POA: Diagnosis not present

## 2020-12-22 DIAGNOSIS — M79602 Pain in left arm: Secondary | ICD-10-CM | POA: Diagnosis not present

## 2021-01-27 ENCOUNTER — Ambulatory Visit: Payer: Medicare HMO | Admitting: Podiatry

## 2021-03-02 DIAGNOSIS — R1011 Right upper quadrant pain: Secondary | ICD-10-CM | POA: Diagnosis not present

## 2021-03-02 DIAGNOSIS — F439 Reaction to severe stress, unspecified: Secondary | ICD-10-CM | POA: Diagnosis not present

## 2021-03-02 DIAGNOSIS — M79602 Pain in left arm: Secondary | ICD-10-CM | POA: Diagnosis not present

## 2021-03-02 DIAGNOSIS — R0989 Other specified symptoms and signs involving the circulatory and respiratory systems: Secondary | ICD-10-CM | POA: Diagnosis not present

## 2021-03-02 DIAGNOSIS — I10 Essential (primary) hypertension: Secondary | ICD-10-CM | POA: Diagnosis not present

## 2021-03-12 DIAGNOSIS — G459 Transient cerebral ischemic attack, unspecified: Secondary | ICD-10-CM | POA: Diagnosis not present

## 2021-03-12 DIAGNOSIS — E785 Hyperlipidemia, unspecified: Secondary | ICD-10-CM | POA: Diagnosis not present

## 2021-03-12 DIAGNOSIS — K219 Gastro-esophageal reflux disease without esophagitis: Secondary | ICD-10-CM | POA: Diagnosis not present

## 2021-03-12 DIAGNOSIS — I10 Essential (primary) hypertension: Secondary | ICD-10-CM | POA: Diagnosis not present

## 2021-03-12 DIAGNOSIS — G4700 Insomnia, unspecified: Secondary | ICD-10-CM | POA: Diagnosis not present

## 2021-03-12 DIAGNOSIS — M170 Bilateral primary osteoarthritis of knee: Secondary | ICD-10-CM | POA: Diagnosis not present

## 2021-03-12 DIAGNOSIS — J4520 Mild intermittent asthma, uncomplicated: Secondary | ICD-10-CM | POA: Diagnosis not present

## 2021-05-13 DIAGNOSIS — E785 Hyperlipidemia, unspecified: Secondary | ICD-10-CM | POA: Diagnosis not present

## 2021-05-13 DIAGNOSIS — M170 Bilateral primary osteoarthritis of knee: Secondary | ICD-10-CM | POA: Diagnosis not present

## 2021-05-13 DIAGNOSIS — G459 Transient cerebral ischemic attack, unspecified: Secondary | ICD-10-CM | POA: Diagnosis not present

## 2021-05-13 DIAGNOSIS — J4520 Mild intermittent asthma, uncomplicated: Secondary | ICD-10-CM | POA: Diagnosis not present

## 2021-05-13 DIAGNOSIS — I10 Essential (primary) hypertension: Secondary | ICD-10-CM | POA: Diagnosis not present

## 2021-05-13 DIAGNOSIS — G4700 Insomnia, unspecified: Secondary | ICD-10-CM | POA: Diagnosis not present

## 2021-05-13 DIAGNOSIS — K219 Gastro-esophageal reflux disease without esophagitis: Secondary | ICD-10-CM | POA: Diagnosis not present

## 2021-05-31 DIAGNOSIS — M5432 Sciatica, left side: Secondary | ICD-10-CM | POA: Diagnosis not present

## 2021-07-19 DIAGNOSIS — G459 Transient cerebral ischemic attack, unspecified: Secondary | ICD-10-CM | POA: Diagnosis not present

## 2021-07-19 DIAGNOSIS — E785 Hyperlipidemia, unspecified: Secondary | ICD-10-CM | POA: Diagnosis not present

## 2021-07-19 DIAGNOSIS — M170 Bilateral primary osteoarthritis of knee: Secondary | ICD-10-CM | POA: Diagnosis not present

## 2021-07-19 DIAGNOSIS — K219 Gastro-esophageal reflux disease without esophagitis: Secondary | ICD-10-CM | POA: Diagnosis not present

## 2021-07-19 DIAGNOSIS — I10 Essential (primary) hypertension: Secondary | ICD-10-CM | POA: Diagnosis not present

## 2021-07-19 DIAGNOSIS — G4700 Insomnia, unspecified: Secondary | ICD-10-CM | POA: Diagnosis not present

## 2021-07-19 DIAGNOSIS — J4520 Mild intermittent asthma, uncomplicated: Secondary | ICD-10-CM | POA: Diagnosis not present

## 2021-07-22 ENCOUNTER — Ambulatory Visit: Payer: Medicare HMO | Admitting: Podiatry

## 2021-07-27 DIAGNOSIS — R300 Dysuria: Secondary | ICD-10-CM | POA: Diagnosis not present

## 2021-07-27 DIAGNOSIS — N3001 Acute cystitis with hematuria: Secondary | ICD-10-CM | POA: Diagnosis not present

## 2021-07-27 DIAGNOSIS — R10814 Left lower quadrant abdominal tenderness: Secondary | ICD-10-CM | POA: Diagnosis not present

## 2021-08-17 DIAGNOSIS — Z1231 Encounter for screening mammogram for malignant neoplasm of breast: Secondary | ICD-10-CM | POA: Diagnosis not present

## 2021-08-18 DIAGNOSIS — R739 Hyperglycemia, unspecified: Secondary | ICD-10-CM | POA: Diagnosis not present

## 2021-08-18 DIAGNOSIS — J4520 Mild intermittent asthma, uncomplicated: Secondary | ICD-10-CM | POA: Diagnosis not present

## 2021-08-18 DIAGNOSIS — G459 Transient cerebral ischemic attack, unspecified: Secondary | ICD-10-CM | POA: Diagnosis not present

## 2021-08-18 DIAGNOSIS — Z6841 Body Mass Index (BMI) 40.0 and over, adult: Secondary | ICD-10-CM | POA: Diagnosis not present

## 2021-08-18 DIAGNOSIS — Z1239 Encounter for other screening for malignant neoplasm of breast: Secondary | ICD-10-CM | POA: Diagnosis not present

## 2021-08-18 DIAGNOSIS — Z1211 Encounter for screening for malignant neoplasm of colon: Secondary | ICD-10-CM | POA: Diagnosis not present

## 2021-08-18 DIAGNOSIS — K900 Celiac disease: Secondary | ICD-10-CM | POA: Diagnosis not present

## 2021-08-18 DIAGNOSIS — I10 Essential (primary) hypertension: Secondary | ICD-10-CM | POA: Diagnosis not present

## 2021-08-18 DIAGNOSIS — Z1389 Encounter for screening for other disorder: Secondary | ICD-10-CM | POA: Diagnosis not present

## 2021-08-18 DIAGNOSIS — E559 Vitamin D deficiency, unspecified: Secondary | ICD-10-CM | POA: Diagnosis not present

## 2021-08-18 DIAGNOSIS — M170 Bilateral primary osteoarthritis of knee: Secondary | ICD-10-CM | POA: Diagnosis not present

## 2021-08-18 DIAGNOSIS — R7309 Other abnormal glucose: Secondary | ICD-10-CM | POA: Diagnosis not present

## 2021-08-18 DIAGNOSIS — E785 Hyperlipidemia, unspecified: Secondary | ICD-10-CM | POA: Diagnosis not present

## 2021-08-18 DIAGNOSIS — Z0000 Encounter for general adult medical examination without abnormal findings: Secondary | ICD-10-CM | POA: Diagnosis not present

## 2021-09-13 DIAGNOSIS — H259 Unspecified age-related cataract: Secondary | ICD-10-CM | POA: Diagnosis not present

## 2021-09-13 DIAGNOSIS — H5213 Myopia, bilateral: Secondary | ICD-10-CM | POA: Diagnosis not present

## 2021-09-13 DIAGNOSIS — H524 Presbyopia: Secondary | ICD-10-CM | POA: Diagnosis not present

## 2021-09-13 DIAGNOSIS — H52223 Regular astigmatism, bilateral: Secondary | ICD-10-CM | POA: Diagnosis not present

## 2021-09-16 ENCOUNTER — Other Ambulatory Visit: Payer: Self-pay

## 2021-09-16 ENCOUNTER — Encounter: Payer: Self-pay | Admitting: Podiatry

## 2021-09-16 ENCOUNTER — Ambulatory Visit: Payer: Medicare HMO | Admitting: Podiatry

## 2021-09-16 VITALS — BP 108/67 | HR 81 | Resp 16

## 2021-09-16 DIAGNOSIS — M79674 Pain in right toe(s): Secondary | ICD-10-CM

## 2021-09-16 DIAGNOSIS — M79675 Pain in left toe(s): Secondary | ICD-10-CM

## 2021-09-16 DIAGNOSIS — B351 Tinea unguium: Secondary | ICD-10-CM | POA: Diagnosis not present

## 2021-09-16 NOTE — Progress Notes (Signed)
This patient returns to the office for evaluation and treatment of long thick painful nails .  This patient is unable to trim her own nails since the patient cannot reach her feet.  Patient says the nails are painful walking and wearing his shoes.  She returns for preventive foot care services.  General Appearance  Alert, conversant and in no acute stress.  Vascular  Dorsalis pedis and posterior tibial  pulses are palpable  bilaterally.  Capillary return is within normal limits  bilaterally. Temperature is within normal limits  bilaterally.  Neurologic  Senn-Weinstein monofilament wire test within normal limits  bilaterally. Muscle power within normal limits bilaterally.  Nails Thick disfigured discolored nails with subungual debris  from hallux to fifth toes bilaterally. No evidence of bacterial infection or drainage bilaterally. Nail growing from left hallux cuticle.  Orthopedic  No limitations of motion  feet .  No crepitus or effusions noted.  No bony pathology or digital deformities noted.  Skin  normotropic skin with no porokeratosis noted bilaterally.  No signs of infections or ulcers noted.     Onychomycosis  Pain in toes right foot  Pain in toes left foot  Debridement  of nails  1-5  B/L with a nail nipper.  Nails were then filed using a dremel tool with no incidents.    RTC 3 months  Gardiner Barefoot DPM

## 2021-09-21 DIAGNOSIS — H25811 Combined forms of age-related cataract, right eye: Secondary | ICD-10-CM | POA: Diagnosis not present

## 2021-09-21 DIAGNOSIS — H25812 Combined forms of age-related cataract, left eye: Secondary | ICD-10-CM | POA: Diagnosis not present

## 2021-09-21 DIAGNOSIS — H43811 Vitreous degeneration, right eye: Secondary | ICD-10-CM | POA: Diagnosis not present

## 2021-09-21 DIAGNOSIS — Z01818 Encounter for other preprocedural examination: Secondary | ICD-10-CM | POA: Diagnosis not present

## 2021-09-30 DIAGNOSIS — H25811 Combined forms of age-related cataract, right eye: Secondary | ICD-10-CM | POA: Diagnosis not present

## 2021-10-21 DIAGNOSIS — H25812 Combined forms of age-related cataract, left eye: Secondary | ICD-10-CM | POA: Diagnosis not present

## 2021-11-16 DIAGNOSIS — I10 Essential (primary) hypertension: Secondary | ICD-10-CM | POA: Diagnosis not present

## 2021-11-16 DIAGNOSIS — E785 Hyperlipidemia, unspecified: Secondary | ICD-10-CM | POA: Diagnosis not present

## 2021-11-16 DIAGNOSIS — K219 Gastro-esophageal reflux disease without esophagitis: Secondary | ICD-10-CM | POA: Diagnosis not present

## 2021-12-23 ENCOUNTER — Ambulatory Visit: Payer: Medicare HMO | Admitting: Podiatry

## 2021-12-23 DIAGNOSIS — M170 Bilateral primary osteoarthritis of knee: Secondary | ICD-10-CM | POA: Insufficient documentation

## 2021-12-23 DIAGNOSIS — E785 Hyperlipidemia, unspecified: Secondary | ICD-10-CM | POA: Insufficient documentation

## 2021-12-23 DIAGNOSIS — K900 Celiac disease: Secondary | ICD-10-CM | POA: Insufficient documentation

## 2021-12-23 DIAGNOSIS — R739 Hyperglycemia, unspecified: Secondary | ICD-10-CM | POA: Insufficient documentation

## 2021-12-23 DIAGNOSIS — G4700 Insomnia, unspecified: Secondary | ICD-10-CM | POA: Insufficient documentation

## 2021-12-29 DIAGNOSIS — Z0100 Encounter for examination of eyes and vision without abnormal findings: Secondary | ICD-10-CM | POA: Diagnosis not present

## 2022-01-25 DIAGNOSIS — R69 Illness, unspecified: Secondary | ICD-10-CM | POA: Diagnosis not present

## 2022-02-06 IMAGING — DX DG TOE GREAT 2+V*R*
3 series · 3 of 3 positions shown · non-contrast
Comparison: None.

CLINICAL DATA: 69-year-old female with trauma to the right great
toe.

EXAM:
RIGHT GREAT TOE

[foot supine lat]
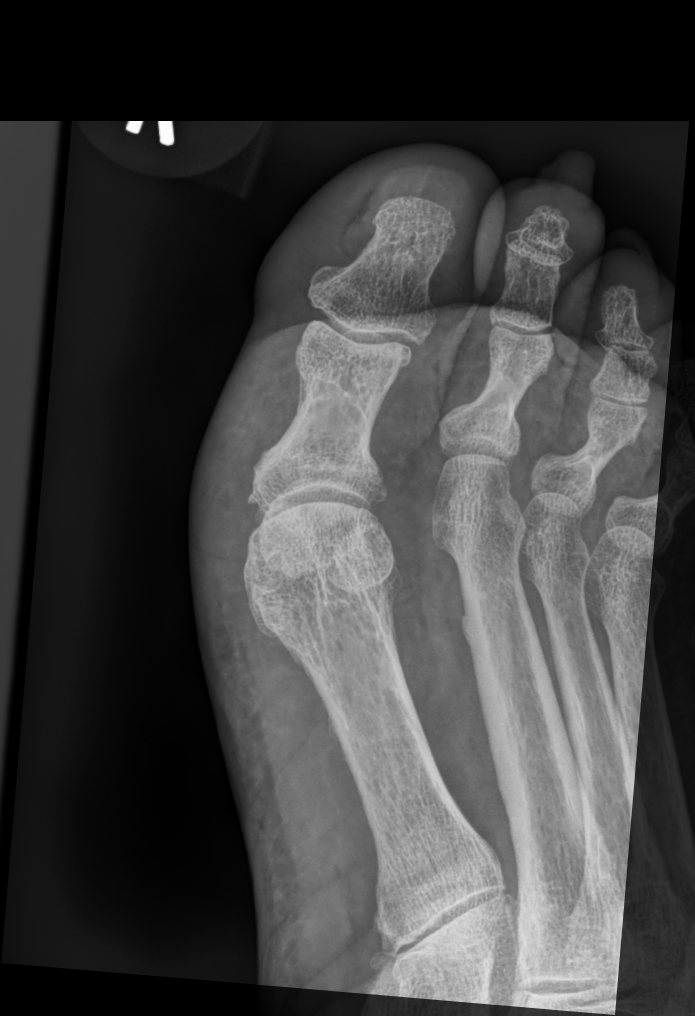

[foot supine dp (1 of 2)]
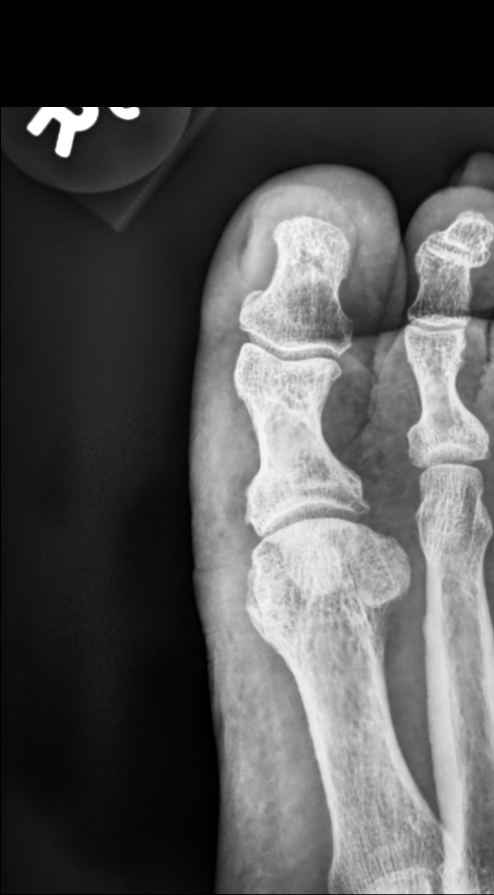

[foot supine dp (2 of 2)]
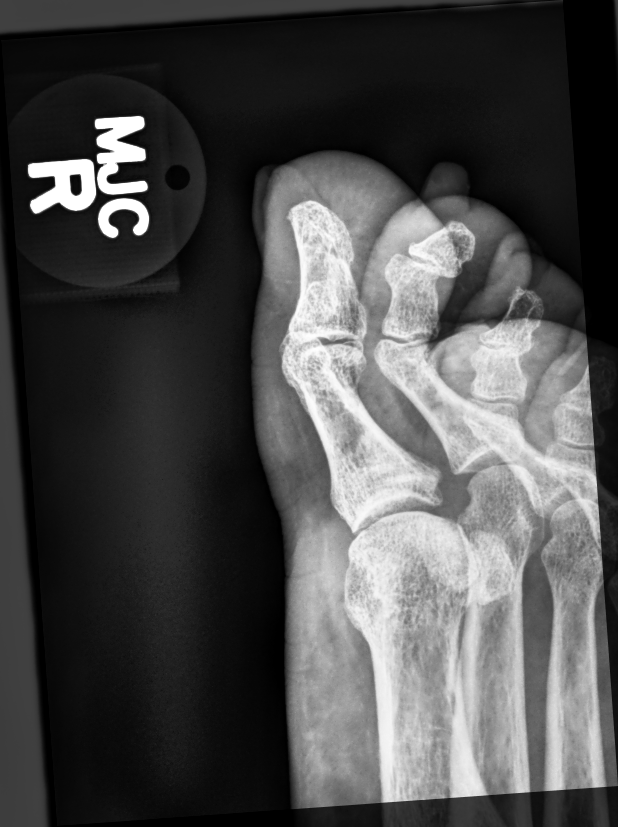

[3 of 3 positions shown; findings below may reference images not displayed]

FINDINGS: There is no acute fracture or dislocation. The bones are osteopenic.
There is mild hallux valgus. There is diffuse subcutaneous edema. No
radiopaque foreign object or soft tissue gas.
IMPRESSION: No acute fracture or dislocation.

## 2022-06-13 ENCOUNTER — Other Ambulatory Visit: Payer: Self-pay

## 2022-06-13 ENCOUNTER — Emergency Department: Payer: Medicare HMO

## 2022-06-13 DIAGNOSIS — R519 Headache, unspecified: Secondary | ICD-10-CM | POA: Diagnosis not present

## 2022-06-13 DIAGNOSIS — R079 Chest pain, unspecified: Secondary | ICD-10-CM | POA: Insufficient documentation

## 2022-06-13 DIAGNOSIS — R0602 Shortness of breath: Secondary | ICD-10-CM | POA: Insufficient documentation

## 2022-06-13 DIAGNOSIS — Z20822 Contact with and (suspected) exposure to covid-19: Secondary | ICD-10-CM | POA: Diagnosis not present

## 2022-06-13 DIAGNOSIS — R197 Diarrhea, unspecified: Secondary | ICD-10-CM | POA: Diagnosis not present

## 2022-06-13 DIAGNOSIS — R110 Nausea: Secondary | ICD-10-CM | POA: Insufficient documentation

## 2022-06-13 DIAGNOSIS — R0789 Other chest pain: Secondary | ICD-10-CM | POA: Diagnosis not present

## 2022-06-13 DIAGNOSIS — I10 Essential (primary) hypertension: Secondary | ICD-10-CM | POA: Diagnosis not present

## 2022-06-13 DIAGNOSIS — R42 Dizziness and giddiness: Secondary | ICD-10-CM | POA: Insufficient documentation

## 2022-06-13 DIAGNOSIS — R112 Nausea with vomiting, unspecified: Secondary | ICD-10-CM | POA: Diagnosis not present

## 2022-06-13 LAB — CBC WITH DIFFERENTIAL/PLATELET
Abs Immature Granulocytes: 0.01 10*3/uL (ref 0.00–0.07)
Basophils Absolute: 0 10*3/uL (ref 0.0–0.1)
Basophils Relative: 1 %
Eosinophils Absolute: 0.2 10*3/uL (ref 0.0–0.5)
Eosinophils Relative: 2 %
HCT: 40.4 % (ref 36.0–46.0)
Hemoglobin: 12.8 g/dL (ref 12.0–15.0)
Immature Granulocytes: 0 %
Lymphocytes Relative: 40 %
Lymphs Abs: 3.4 10*3/uL (ref 0.7–4.0)
MCH: 29.4 pg (ref 26.0–34.0)
MCHC: 31.7 g/dL (ref 30.0–36.0)
MCV: 92.9 fL (ref 80.0–100.0)
Monocytes Absolute: 0.8 10*3/uL (ref 0.1–1.0)
Monocytes Relative: 9 %
Neutro Abs: 4.2 10*3/uL (ref 1.7–7.7)
Neutrophils Relative %: 48 %
Platelets: 279 10*3/uL (ref 150–400)
RBC: 4.35 MIL/uL (ref 3.87–5.11)
RDW: 12.9 % (ref 11.5–15.5)
WBC: 8.7 10*3/uL (ref 4.0–10.5)
nRBC: 0 % (ref 0.0–0.2)

## 2022-06-13 LAB — COMPREHENSIVE METABOLIC PANEL
ALT: 17 U/L (ref 0–44)
AST: 20 U/L (ref 15–41)
Albumin: 3.9 g/dL (ref 3.5–5.0)
Alkaline Phosphatase: 71 U/L (ref 38–126)
Anion gap: 9 (ref 5–15)
BUN: 14 mg/dL (ref 8–23)
CO2: 26 mmol/L (ref 22–32)
Calcium: 9.9 mg/dL (ref 8.9–10.3)
Chloride: 102 mmol/L (ref 98–111)
Creatinine, Ser: 0.64 mg/dL (ref 0.44–1.00)
GFR, Estimated: >60 mL/min (ref >60)
Glucose, Bld: 105 mg/dL — ABNORMAL HIGH (ref 70–99)
Potassium: 3.7 mmol/L (ref 3.5–5.1)
Sodium: 137 mmol/L (ref 135–145)
Total Bilirubin: 0.7 mg/dL (ref 0.3–1.2)
Total Protein: 7.8 g/dL (ref 6.5–8.1)

## 2022-06-13 LAB — TROPONIN I (HIGH SENSITIVITY): Troponin I (High Sensitivity): 6 ng/L (ref <18)

## 2022-06-13 LAB — PROTIME-INR
INR: 1.1 (ref 0.8–1.2)
Prothrombin Time: 13.8 seconds (ref 11.4–15.2)

## 2022-06-13 NOTE — ED Triage Notes (Signed)
Pt arrives from home POV.  Pt C/O "throbbing" and "knocking" in head anterior and posterior, nausea and dizziness x 2 days.  CP, "squeezing" left side under breast w/SHOB began today. HX TIA, 81 asa daily.

## 2022-06-13 NOTE — ED Provider Triage Note (Signed)
Emergency Medicine Provider Triage Evaluation Note  Martha Chambers , a 71 y.o. female  was evaluated in triage.  Pt complains of severe headache, chest pain.  Patient states that she started with a headache over the weekend.  Very atypical and describes it as a "knocking" sensation.  Patient with a history of TIA but no CVA.  Denies unilateral weakness or slurred speech.  Patient began to have chest pain and shortness of breath as well as profound tachycardia.  She states that she wears a Fitbit and with any movement, her heart rate will jump up into the 160s according to the Fitbit.  No history of arrhythmia.  No cardiac history.  She endorses tight chest pain, exertional shortness of breath and tachycardia with any walking or movement..  Review of Systems  Positive: Sharp headache, chest pain, shortness of breath, tachyarrhythmia according to her Fitbit Negative: Fevers, chills, unilateral weakness, slurred speech, GI complaints  Physical Exam  BP (!) 162/74 (BP Location: Left Arm)   Pulse 74   Temp 98.4 F (36.9 C) (Oral)   Resp 18   Ht '5\' 5"'$  (1.651 m)   Wt 134.7 kg   SpO2 95%   BMI 49.42 kg/m  Gen:   Awake, no distress   Resp:  Normal effort  MSK:   Moves extremities without difficulty  Other:    Medical Decision Making  Medically screening exam initiated at 10:51 PM.  Appropriate orders placed.  ERION HERMANS was informed that the remainder of the evaluation will be completed by another provider, this initial triage assessment does not replace that evaluation, and the importance of remaining in the ED until their evaluation is complete.  Patient presents to the ED with reports of sharp headache for 3 days, chest pain shortness of breath today.  She also reports that anytime she gets up and walks around her Fitbit tells her that her heart rate is 160+.  Patient with labs, EKG, chest x-ray, CT scan of the head.   [name], PA-C 06/13/22 2251

## 2022-06-14 ENCOUNTER — Emergency Department
Admission: EM | Admit: 2022-06-14 | Discharge: 2022-06-14 | Disposition: A | Discharge status: Home / Self Care | Payer: Medicare HMO | Attending: Emergency Medicine | Admitting: Emergency Medicine

## 2022-06-14 ENCOUNTER — Encounter: Payer: Self-pay | Admitting: Emergency Medicine

## 2022-06-14 DIAGNOSIS — R079 Chest pain, unspecified: Secondary | ICD-10-CM

## 2022-06-14 DIAGNOSIS — R197 Diarrhea, unspecified: Secondary | ICD-10-CM

## 2022-06-14 DIAGNOSIS — R110 Nausea: Secondary | ICD-10-CM

## 2022-06-14 DIAGNOSIS — R42 Dizziness and giddiness: Secondary | ICD-10-CM

## 2022-06-14 LAB — TROPONIN I (HIGH SENSITIVITY): Troponin I (High Sensitivity): 6 ng/L (ref <18)

## 2022-06-14 LAB — RESP PANEL BY RT-PCR (FLU A&B, COVID) ARPGX2
Influenza A by PCR: NEGATIVE
Influenza B by PCR: NEGATIVE
SARS Coronavirus 2 by RT PCR: NEGATIVE

## 2022-06-14 MED ORDER — ONDANSETRON 8 MG PO TBDP: 8.0000 mg | ORAL_TABLET | Freq: Three times a day (TID) | ORAL | 0 refills | Status: AC | PRN

## 2022-06-14 NOTE — ED Provider Notes (Signed)
[facility] Provider Note   Event Date/Time   First MD Initiated Contact with Patient 06/14/22 0732     (approximate) History  Chest Pain  HPI Martha Chambers is a 71 y.o. female with a past medical history of hypertension, TIA in the past, morbid obesity, and diverticulosis who presents for diarrhea over the past 3 days with associated mild nausea and orthostatic lightheadedness.  Patient also now complains of headache that she describes as both front and back of her head that is pounding, 6/10 in severity, nonradiating.  Patient states that this headache is worse upon standing from a seated position and also accompanying lightheadedness.  Patient also complains of a squeezing chest pain under her left breast with associated shortness of breath over the last 24 hours.  Patient denies any exacerbating or relieving factors ROS: Patient currently denies any vision changes, tinnitus, difficulty speaking, facial droop, sore throat, abdominal pain, vomiting/diarrhea, dysuria, or weakness/numbness/paresthesias in any extremity   Physical Exam  Triage Vital Signs: ED Triage Vitals  Enc Vitals Group     BP 06/13/22 2249 (!) 162/74     Pulse Rate 06/13/22 2249 74     Resp 06/13/22 2249 18     Temp 06/13/22 2249 98.4 F (36.9 C)     Temp Source 06/13/22 2249 Oral     SpO2 06/13/22 2249 95 %     Weight 06/13/22 2250 297 lb (134.7 kg)     Height 06/13/22 2250 '5\' 5"'$  (1.651 m)     Head Circumference --      Peak Flow --      Pain Score 06/13/22 2250 6     Pain Loc --      Pain Edu? --      Excl. in [place]? --    Most recent vital signs: Vitals:   06/14/22 0207 06/14/22 0731  BP: (!) 155/70 (!) 150/70  Pulse: 69 70  Resp: 20 18  Temp: 98.4 F (36.9 C) 98 F (36.7 C)  SpO2: 97% 98%   General: Awake, oriented x4. CV:  Good peripheral perfusion.  Resp:  Normal effort.  Abd:  No distention.  Other:  Elderly morbidly obese African-American female sitting in wheelchair  in no acute distress. ED Results / Procedures / Treatments  Labs (all labs ordered are listed, but only abnormal results are displayed) Labs Reviewed  COMPREHENSIVE METABOLIC PANEL - Abnormal; Notable for the following components:      Result Value   Glucose, Bld 105 (*)    All other components within normal limits  RESP PANEL BY RT-PCR (FLU A&B, COVID) ARPGX2  CBC WITH DIFFERENTIAL/PLATELET  PROTIME-INR  TROPONIN I (HIGH SENSITIVITY)  TROPONIN I (HIGH SENSITIVITY)   EKG ED ECG REPORT I, Naaman Plummer, the attending physician, personally viewed and interpreted this ECG. Date: 06/14/2022 EKG Time: 2245 Rate: 74 Rhythm: normal sinus rhythm QRS Axis: normal Intervals: normal ST/T Wave abnormalities: normal Narrative Interpretation: no evidence of acute ischemia RADIOLOGY ED MD interpretation: 2 view chest x-ray interpreted by me shows no evidence of acute abnormalities including no pneumonia, pneumothorax, or widened mediastinum  CT of the head without contrast interpreted by me shows no evidence of acute abnormalities including no intracerebral hemorrhage, obvious masses, or significant edema -Agree with radiology assessment Official radiology report(s): DG Chest 2 View  Result Date: 06/13/2022 CLINICAL DATA:  Chest pain EXAM: CHEST - 2 VIEW COMPARISON:  Radiographs 09/09/2011 FINDINGS: No focal consolidation, pleural effusion, or pneumothorax. Normal  cardiomediastinal silhouette. No acute osseous abnormality. Aortic atherosclerotic calcification. IMPRESSION: No active cardiopulmonary disease. Electronically Signed   By: Placido Sou M.D.   On: 06/13/2022 23:16   CT Head Wo Contrast  Result Date: 06/13/2022 CLINICAL DATA:  Headache, sudden, severe EXAM: CT HEAD WITHOUT CONTRAST TECHNIQUE: Contiguous axial images were obtained from the base of the skull through the vertex without intravenous contrast. RADIATION DOSE REDUCTION: This exam was performed according to the  departmental dose-optimization program which includes automated exposure control, adjustment of the mA and/or kV according to patient size and/or use of iterative reconstruction technique. COMPARISON:  08/07/2014 FINDINGS: Brain: No acute intracranial abnormality. Specifically, no hemorrhage, hydrocephalus, mass lesion, acute infarction, or significant intracranial injury. Vascular: No hyperdense vessel or unexpected calcification. Skull: No acute calvarial abnormality. Sinuses/Orbits: No acute findings Other: None IMPRESSION: No acute intracranial abnormality. Electronically Signed   By: Rolm Baptise M.D.   On: 06/13/2022 23:06   PROCEDURES: Critical Care performed: No .1-3 Lead EKG Interpretation  Performed by: Naaman Plummer, MD Authorized by: Naaman Plummer, MD     Interpretation: normal     ECG rate:  69   ECG rate assessment: normal     Rhythm: sinus rhythm     Ectopy: none     Conduction: normal    MEDICATIONS ORDERED IN ED: Medications - No data to display IMPRESSION / MDM / [place] / ED COURSE  I reviewed the triage vital signs and the nursing notes.                             The patient is on the cardiac monitor to evaluate for evidence of arrhythmia and/or significant heart rate changes. Patient's presentation is most consistent with acute presentation with potential threat to life or bodily function. This patient presents with diarrhea consistent with likely viral enteritis given no leukocytosis. Doubt acute bacterial diarrhea. Considered, but think unlikely, partial SBO, appendicitis, diverticulitis, other intraabdominal infection. Low suspicion for secondary causes of diarrhea such as hyperadrenergic state, pheo, adrenal crisis, hyperthyroidism, or sepsis. Doubt antibiotic associated diarrhea.  Concern for other symptoms including orthostatic lightheadedness, generalized headache, and fatigue likely secondary to diarrhea/dehydration/viral illness.  Given results of  EKG, chest x-ray, and negative troponin, I have low suspicion for acute ACS, CHF exacerbation, aortic disease, or malignant arrhythmia  Plan: PO rehydration, reassess, discharge with OTC antidiarrheal meds//short course antibiotics  Dispo: Discharge home with PCP follow-up and strict return precautions   FINAL CLINICAL IMPRESSION(S) / ED DIAGNOSES   Final diagnoses:  Chest pain, unspecified type  Orthostatic lightheadedness  Nausea without vomiting  Diarrhea, unspecified type   Rx / DC Orders   ED Discharge Orders          Ordered    ondansetron (ZOFRAN-ODT) 8 MG disintegrating tablet  Every 8 hours PRN        06/14/22 0748           Note:  This document was prepared using Dragon voice recognition software and may include unintentional dictation errors.   Naaman Plummer, MD 06/14/22 [phone]

## 2022-06-14 NOTE — ED Notes (Signed)
See triage note  Presents with headache since Saturday  Describes as throbbing  Also having some intermittent chest discomfort  Denies any n/v/,cough or fever

## 2022-06-24 DIAGNOSIS — I10 Essential (primary) hypertension: Secondary | ICD-10-CM | POA: Diagnosis not present

## 2022-06-24 DIAGNOSIS — Z6841 Body Mass Index (BMI) 40.0 and over, adult: Secondary | ICD-10-CM | POA: Diagnosis not present

## 2022-06-24 DIAGNOSIS — R5383 Other fatigue: Secondary | ICD-10-CM | POA: Diagnosis not present

## 2022-06-24 DIAGNOSIS — R002 Palpitations: Secondary | ICD-10-CM | POA: Diagnosis not present

## 2022-06-24 DIAGNOSIS — R42 Dizziness and giddiness: Secondary | ICD-10-CM | POA: Diagnosis not present

## 2022-07-13 DIAGNOSIS — R002 Palpitations: Secondary | ICD-10-CM | POA: Diagnosis not present

## 2022-07-18 DIAGNOSIS — R002 Palpitations: Secondary | ICD-10-CM | POA: Diagnosis not present

## 2022-08-18 DIAGNOSIS — Z1231 Encounter for screening mammogram for malignant neoplasm of breast: Secondary | ICD-10-CM | POA: Diagnosis not present

## 2022-10-06 DIAGNOSIS — Z6841 Body Mass Index (BMI) 40.0 and over, adult: Secondary | ICD-10-CM | POA: Diagnosis not present

## 2022-10-06 DIAGNOSIS — I10 Essential (primary) hypertension: Secondary | ICD-10-CM | POA: Diagnosis not present

## 2022-10-06 DIAGNOSIS — E785 Hyperlipidemia, unspecified: Secondary | ICD-10-CM | POA: Diagnosis not present

## 2022-10-06 DIAGNOSIS — R7303 Prediabetes: Secondary | ICD-10-CM | POA: Diagnosis not present

## 2022-10-06 DIAGNOSIS — J4520 Mild intermittent asthma, uncomplicated: Secondary | ICD-10-CM | POA: Diagnosis not present

## 2022-10-06 DIAGNOSIS — Z0000 Encounter for general adult medical examination without abnormal findings: Secondary | ICD-10-CM | POA: Diagnosis not present

## 2022-10-06 DIAGNOSIS — E559 Vitamin D deficiency, unspecified: Secondary | ICD-10-CM | POA: Diagnosis not present

## 2022-10-06 DIAGNOSIS — Z23 Encounter for immunization: Secondary | ICD-10-CM | POA: Diagnosis not present

## 2022-10-06 DIAGNOSIS — Z8673 Personal history of transient ischemic attack (TIA), and cerebral infarction without residual deficits: Secondary | ICD-10-CM | POA: Diagnosis not present

## 2022-10-06 DIAGNOSIS — Z13820 Encounter for screening for osteoporosis: Secondary | ICD-10-CM | POA: Diagnosis not present

## 2022-10-06 DIAGNOSIS — Z1331 Encounter for screening for depression: Secondary | ICD-10-CM | POA: Diagnosis not present

## 2022-10-19 DIAGNOSIS — Z780 Asymptomatic menopausal state: Secondary | ICD-10-CM | POA: Diagnosis not present

## 2023-02-27 ENCOUNTER — Encounter: Payer: Self-pay | Admitting: Unknown Physician Specialty

## 2023-02-27 DIAGNOSIS — K1120 Sialoadenitis, unspecified: Secondary | ICD-10-CM

## 2023-02-28 ENCOUNTER — Other Ambulatory Visit: Payer: Self-pay | Admitting: Unknown Physician Specialty

## 2023-02-28 DIAGNOSIS — K1120 Sialoadenitis, unspecified: Secondary | ICD-10-CM

## 2023-03-07 ENCOUNTER — Ambulatory Visit
Admission: RE | Admit: 2023-03-07 | Discharge: 2023-03-07 | Disposition: A | Discharge status: Home / Self Care | Payer: Medicare HMO | Source: Ambulatory Visit | Attending: Unknown Physician Specialty | Admitting: Unknown Physician Specialty

## 2023-03-07 DIAGNOSIS — K1120 Sialoadenitis, unspecified: Secondary | ICD-10-CM

## 2023-03-07 MED ORDER — IOPAMIDOL (ISOVUE-300) INJECTION 61%
75.0000 mL | Freq: Once | INTRAVENOUS | Status: AC | PRN
  Administered 2023-03-07: 75 mL via INTRAVENOUS

## 2023-10-31 ENCOUNTER — Encounter: Payer: Self-pay | Admitting: Sports Medicine

## 2023-11-01 ENCOUNTER — Other Ambulatory Visit: Payer: Self-pay | Admitting: Sports Medicine

## 2023-11-01 DIAGNOSIS — M25619 Stiffness of unspecified shoulder, not elsewhere classified: Secondary | ICD-10-CM

## 2023-11-01 DIAGNOSIS — M25511 Pain in right shoulder: Secondary | ICD-10-CM

## 2023-11-01 DIAGNOSIS — S46811A Strain of other muscles, fascia and tendons at shoulder and upper arm level, right arm, initial encounter: Secondary | ICD-10-CM

## 2023-11-07 ENCOUNTER — Ambulatory Visit: Payer: Medicare HMO | Attending: Sports Medicine

## 2023-11-07 DIAGNOSIS — G8929 Other chronic pain: Secondary | ICD-10-CM | POA: Diagnosis present

## 2023-11-07 DIAGNOSIS — M6281 Muscle weakness (generalized): Secondary | ICD-10-CM | POA: Diagnosis present

## 2023-11-07 DIAGNOSIS — M25511 Pain in right shoulder: Secondary | ICD-10-CM | POA: Insufficient documentation

## 2023-11-07 NOTE — Therapy (Signed)
 OUTPATIENT PHYSICAL THERAPY UPPER EXTREMITY EVALUATION/TREATMENT   Patient Name: Martha Chambers MRN: [id number] DOB:25-Feb-1951, 73 y.o., female Today's Date: 11/07/2023  END OF SESSION:  PT End of Session - 11/07/23 1037     Visit Number 1    Number of Visits 17    Date for PT Re-Evaluation 01/02/24    PT Start Time 0945    PT Stop Time 1030    PT Time Calculation (min) 45 min    Activity Tolerance Patient tolerated treatment well    Behavior During Therapy WFL for tasks assessed/performed             Past Medical History:  Diagnosis Date   Arthritis    GERD (gastroesophageal reflux disease)    Hypertension    Past Surgical History:  Procedure Laterality Date   ABDOMINAL HYSTERECTOMY     CHOLECYSTECTOMY     KNEE ARTHROPLASTY  07   rt   TONSILLECTOMY     TOTAL KNEE ARTHROPLASTY  09/21/2011   Procedure: TOTAL KNEE ARTHROPLASTY;  Surgeon: Loreta Ave, MD;  Location: [facility] OR;  Service: Orthopedics;  Laterality: Left;  120 MINUTES FOR THIS SURGERY   Patient Active Problem List   Diagnosis Date Noted   Bilateral primary osteoarthritis of knee 12/23/2021   Celiac disease 12/23/2021   Hyperglycemia 12/23/2021   Hyperlipidemia 12/23/2021   Insomnia 12/23/2021   Abnormal weight gain 10/28/2020   Change in bowel habit 10/28/2020   Diverticular disease of colon 10/28/2020   Diverticulosis of large intestine without perforation or abscess without bleeding 10/28/2020   Dysphagia 10/28/2020   Epigastric pain 10/28/2020   Periumbilical pain 10/28/2020   Morbid obesity (HCC) 10/28/2020   Nausea 10/28/2020   Right lower quadrant pain 10/28/2020   Pain due to onychomycosis of toenails of both feet 10/28/2020   HTN (hypertension) 08/07/2014   TIA (transient ischemic attack) 08/07/2014   GERD (gastroesophageal reflux disease)    Arthritis     PCP: Eleanora Neighbor MD  REFERRING PROVIDER: Hurman Horn, MD  REFERRING DIAG: M25.511 (ICD-10-CM) - Right shoulder  pain  THERAPY DIAG:  Chronic right shoulder pain  Muscle weakness (generalized)  Rationale for Evaluation and Treatment: Rehabilitation  ONSET DATE: fall almost 48 years ago  SUBJECTIVE:                                                                                                                                                                                      SUBJECTIVE STATEMENT: Pt is a pleasant 73 y.o. female referred to OPPT for chronic R shoulder pain. Hand dominance: Right  PERTINENT HISTORY: Pt reports falling onto R shoulder during a pregnancy ~  48 years ago at work. Has had prior bouts of PT and saw MD. Reports not a lot of success with PT. Her R shoulder when she was younger she had little to no issues. As she has gotten older it has bothered her more. Reports difficulty with sleep. Actually able to sleep on her R side. More issues with sleeping on her L side with shoulder propped on pillow. Unable to elevate her arm very high. Has weakness in RUE and pain. Described as dull and achey. Catching pain with overhead ranges. Has to use LUE to assist RUE primarily when her pain is bad. Has difficulty with doing her hair. Reports most difficulty with weighted ADL's like cooking and putting dishes away over head. Some difficulty reaching behind her back primarily with bathing ADL's. Does report hand numbness holding her phone. Has referred pain down lateral arm to her elbow. Reports similar pain in LUE thinking do to compensation for RUE. Reports worst pain upwards to 8-9/10 NPS at night. 3-4/10 NPS during the day with UE use.   PAIN:  Are you having pain? Yes: NPRS scale: 3-4/10 NPS Pain location: anterior and superior R shoulder pain Pain description: dull/achey/catching Aggravating factors: L side lying, overhead R shoulder ranges Relieving factors: Rest  PRECAUTIONS: None  RED FLAGS: None   WEIGHT BEARING RESTRICTIONS: No  FALLS:  Has patient fallen in last 6 months?  No  LIVING ENVIRONMENT: Lives with: lives with their spouse  OCCUPATION: Retired [employer] fundraiser  PLOF: Independent  PATIENT GOALS: Improve RUE strength, pain and AROM  NEXT MD VISIT: Within 1-2 months  OBJECTIVE:  Note: Objective measures were completed at Evaluation unless otherwise noted.  DIAGNOSTIC FINDINGS:  N/A  PATIENT SURVEYS :  Quick Dash 61.4%  COGNITION: Overall cognitive status: Within functional limits for tasks assessed     SENSATION: Light touch: WFL  POSTURE: Mildly rounded shoulders  CERVICAL AROM: Flexion: 55 Extension: 30* Rotation R/L: 35/50 Lateral flexion R/L: 25*/20  UPPER EXTREMITY ROM:   Active ROM Right eval Left eval  Shoulder flexion 98* (162 PROM) 135  Shoulder extension    Shoulder abduction 96* (140 PROM) 140  Shoulder adduction    Shoulder internal rotation 88 90  Shoulder external rotation 65* 78  Elbow flexion    Elbow extension    Wrist flexion    Wrist extension    Wrist ulnar deviation    Wrist radial deviation    Wrist pronation    Wrist supination    (Blank rows = not tested)  UPPER EXTREMITY MMT:  MMT Right eval Left eval  Shoulder flexion 3+* 4  Shoulder extension    Shoulder abduction 3+* 4  Shoulder adduction    Shoulder internal rotation 4* 5  Shoulder external rotation 3+ 5  Middle trapezius    Lower trapezius    Elbow flexion 4 4  Elbow extension 3 4  Wrist flexion    Wrist extension    Wrist ulnar deviation    Wrist radial deviation    Wrist pronation    Wrist supination    Grip strength (lbs)    (Blank rows = not tested)  SHOULDER SPECIAL TESTS: Impingement tests: Hawkins/Kennedy impingement test: positive  Rotator cuff assessment: Empty can test: positive , Full can test: positive , and Infraspinatus test: positive   Scapular assist test: positive   CERVICAL SPECIAL TESTS: Spurling's: A and B negative   WRIST SPECIAL TESTS: Phalen's: negative bilat  JOINT MOBILITY TESTING:  GHJ  AP/PA/inferior: normal  and painless in all planes.  PALPATION:  TTP along long head of biceps tendon and supraspinatus tendon.                                                                                                                             TREATMENT DATE: 11/07/23  There.Ex:  Seated pulley's R shoulder flexion AAROM: x15   Standing B scap retractions with blue TB: x12. Educated pt on use of blue TB and how to safely anchor and use in doorway.   Educated on HEP hand out provided. Reviewed Reps/sets/frequency   PATIENT EDUCATION: Education details: POC, prognosis, HEP Person educated: Patient Education method: Explanation, Demonstration, and Handouts Education comprehension: verbalized understanding and returned demonstration  HOME EXERCISE PROGRAM: Access Code: [email or web address] URL: [email or web address] Date: 11/07/2023 Prepared by: Ronnie Derby  Exercises - Seated Shoulder Flexion AAROM with Pulley Behind  - 1 x daily - 7 x weekly - 2 sets - 15 reps - Scapular Retraction with Resistance  - 1 x daily - 4-5 x weekly - 3 sets - 12 reps  ASSESSMENT:  CLINICAL IMPRESSION: Patient is a 73 y.o. female who was seen today for physical therapy evaluation and treatment for chronic R shoulder pain. Pt presents with painful and limited R shoulder AROM in abduction, flexion, and ER. Pt also demonstrates significant R shoulder weakness in al these planes. PROM of R shoulder grossly within normal ranges with normal GHJ mobility testing indicative of muscular/tendon pathology. Pt's symptoms are suggestive of possible R shoulder RTC tear due to AROM and weakness deficits. These deficits are limiting pt in participation of overhead ADL's, cooking, sleeping, and bathing. Pt will benefit from skilled PT services to address these deficits and maximize functional capacity of RUE to PLOF.   OBJECTIVE IMPAIRMENTS: decreased mobility, decreased ROM, decreased strength, impaired UE  functional use, postural dysfunction, and pain.   ACTIVITY LIMITATIONS: carrying, lifting, sleeping, bathing, reach over head, and hygiene/grooming  PARTICIPATION LIMITATIONS: cleaning, laundry, and shopping  PERSONAL FACTORS: Age, Past/current experiences, Profession, and Time since onset of injury/illness/exacerbation are also affecting patient's functional outcome.   REHAB POTENTIAL: Fair chronicity of symptoms  CLINICAL DECISION MAKING: Evolving/moderate complexity  EVALUATION COMPLEXITY: Moderate  GOALS: Goals reviewed with patient? No  SHORT TERM GOALS: Target date: 12/05/23  Pt will be independent with HEP to improve RUE pain and ROM for overhead ADL's Baseline: 2/18.25: provided initial HEP Goal status: INITIAL  LONG TERM GOALS: Target date: 01/02/24  Pt will improve QuickDash by at least 10 points to demonstrate clinically significant improvement in reduced disability due to RUE impairments.  Baseline: 11/07/23: 61.4% Goal status: INITIAL  2.  Pt will improve R shoulder flexion to at least 120 degrees to demonstrate clinically significant improvement in RUE use for overhead ADL's. Baseline: 11/07/23: 98 degrees Goal status: INITIAL  3.  Pt will report worst pain as a 6/10 NPS or less with cooking and other overhead ADL's to demonstrate clinically significant reduction  in pain.  Baseline: 11/08/23: 8/10 NPS Goal status: INITIAL PLAN: PT FREQUENCY: 1-2x/week  PT DURATION: 8 weeks  PLANNED INTERVENTIONS: [id number]- PT Re-evaluation, 97110-Therapeutic exercises, 97530- Therapeutic activity, 97112- Neuromuscular re-education, 97535- Self Care, [id number]- Manual therapy, [id number]- Electrical stimulation (unattended), [phone]- Electrical stimulation (manual), Patient/Family education, Dry Needling, Joint mobilization, Joint manipulation, Spinal manipulation, Spinal mobilization, Cryotherapy, and Moist heat  PLAN FOR NEXT SESSION: Review HEP, scapulohumeral rhythm, serratus anterior  strengthening    Delphia Grates. Fairly IV, PT, DPT Physical Therapist- [place]  [facility]  11/07/2023, 8:26 PM

## 2023-11-22 ENCOUNTER — Ambulatory Visit: Payer: Medicare HMO | Attending: Sports Medicine

## 2023-11-22 ENCOUNTER — Telehealth: Payer: Self-pay

## 2023-11-22 DIAGNOSIS — M6281 Muscle weakness (generalized): Secondary | ICD-10-CM | POA: Insufficient documentation

## 2023-11-22 DIAGNOSIS — M25511 Pain in right shoulder: Secondary | ICD-10-CM | POA: Insufficient documentation

## 2023-11-22 DIAGNOSIS — G8929 Other chronic pain: Secondary | ICD-10-CM | POA: Insufficient documentation

## 2023-11-22 NOTE — Telephone Encounter (Signed)
 Called patient but no answer, LVM about missed PT appointment and informed on next appointment day and time.

## 2023-11-28 ENCOUNTER — Ambulatory Visit: Payer: Medicare HMO

## 2023-11-28 DIAGNOSIS — G8929 Other chronic pain: Secondary | ICD-10-CM | POA: Diagnosis present

## 2023-11-28 DIAGNOSIS — M6281 Muscle weakness (generalized): Secondary | ICD-10-CM

## 2023-11-28 DIAGNOSIS — M25511 Pain in right shoulder: Secondary | ICD-10-CM | POA: Diagnosis present

## 2023-11-28 NOTE — Therapy (Signed)
 OUTPATIENT PHYSICAL THERAPY UPPER EXTREMITY TREATMENT   Patient Name: Martha Chambers MRN: [id number] DOB:Jul 05, 1951, 73 y.o., female Today's Date: 11/28/2023  END OF SESSION:  PT End of Session - 11/28/23 0901     Visit Number 2    Number of Visits 17    Date for PT Re-Evaluation 01/02/24    PT Start Time 0901    PT Stop Time 0940    PT Time Calculation (min) 39 min    Activity Tolerance Patient tolerated treatment well    Behavior During Therapy WFL for tasks assessed/performed             Past Medical History:  Diagnosis Date   Arthritis    GERD (gastroesophageal reflux disease)    Hypertension    Past Surgical History:  Procedure Laterality Date   ABDOMINAL HYSTERECTOMY     CHOLECYSTECTOMY     KNEE ARTHROPLASTY  07   rt   TONSILLECTOMY     TOTAL KNEE ARTHROPLASTY  09/21/2011   Procedure: TOTAL KNEE ARTHROPLASTY;  Surgeon: Loreta Ave, MD;  Location: [facility] OR;  Service: Orthopedics;  Laterality: Left;  120 MINUTES FOR THIS SURGERY   Patient Active Problem List   Diagnosis Date Noted   Bilateral primary osteoarthritis of knee 12/23/2021   Celiac disease 12/23/2021   Hyperglycemia 12/23/2021   Hyperlipidemia 12/23/2021   Insomnia 12/23/2021   Abnormal weight gain 10/28/2020   Change in bowel habit 10/28/2020   Diverticular disease of colon 10/28/2020   Diverticulosis of large intestine without perforation or abscess without bleeding 10/28/2020   Dysphagia 10/28/2020   Epigastric pain 10/28/2020   Periumbilical pain 10/28/2020   Morbid obesity (HCC) 10/28/2020   Nausea 10/28/2020   Right lower quadrant pain 10/28/2020   Pain due to onychomycosis of toenails of both feet 10/28/2020   HTN (hypertension) 08/07/2014   TIA (transient ischemic attack) 08/07/2014   GERD (gastroesophageal reflux disease)    Arthritis     PCP: Eleanora Neighbor MD  REFERRING PROVIDER: Hurman Horn, MD  REFERRING DIAG: M25.511 (ICD-10-CM) - Right shoulder pain  THERAPY  DIAG:  Chronic right shoulder pain  Muscle weakness (generalized)  Rationale for Evaluation and Treatment: Rehabilitation  ONSET DATE: fall almost 48 years ago  SUBJECTIVE:                                                                                                                                                                                      SUBJECTIVE STATEMENT: Pt reports compliance with HEP. Having some pain with her exercises.  Hand dominance: Right  PERTINENT HISTORY: Pt reports falling onto R shoulder during a pregnancy ~48 years ago  at work. Has had prior bouts of PT and saw MD. Reports not a lot of success with PT. Her R shoulder when she was younger she had little to no issues. As she has gotten older it has bothered her more. Reports difficulty with sleep. Actually able to sleep on her R side. More issues with sleeping on her L side with shoulder propped on pillow. Unable to elevate her arm very high. Has weakness in RUE and pain. Described as dull and achey. Catching pain with overhead ranges. Has to use LUE to assist RUE primarily when her pain is bad. Has difficulty with doing her hair. Reports most difficulty with weighted ADL's like cooking and putting dishes away over head. Some difficulty reaching behind her back primarily with bathing ADL's. Does report hand numbness holding her phone. Has referred pain down lateral arm to her elbow. Reports similar pain in LUE thinking do to compensation for RUE. Reports worst pain upwards to 8-9/10 NPS at night. 3-4/10 NPS during the day with UE use.   PAIN:  Are you having pain? Yes: NPRS scale: 3-4/10 NPS Pain location: anterior and superior R shoulder pain Pain description: dull/achey/catching Aggravating factors: L side lying, overhead R shoulder ranges Relieving factors: Rest  PRECAUTIONS: None  RED FLAGS: None   WEIGHT BEARING RESTRICTIONS: No  FALLS:  Has patient fallen in last 6 months? No  LIVING  ENVIRONMENT: Lives with: lives with their spouse  OCCUPATION: Retired Char[employer] fundraiser  PLOF: Independent  PATIENT GOALS: Improve RUE strength, pain and AROM  NEXT MD VISIT: Within 1-2 months  OBJECTIVE:  Note: Objective measures were completed at Evaluation unless otherwise noted.  DIAGNOSTIC FINDINGS:  N/A  PATIENT SURVEYS :  Quick Dash 61.4%  COGNITION: Overall cognitive status: Within functional limits for tasks assessed     SENSATION: Light touch: WFL  POSTURE: Mildly rounded shoulders  CERVICAL AROM: Flexion: 55 Extension: 30* Rotation R/L: 35/50 Lateral flexion R/L: 25*/20  UPPER EXTREMITY ROM:   Active ROM Right eval Left eval  Shoulder flexion 98* (162 PROM) 135  Shoulder extension    Shoulder abduction 96* (140 PROM) 140  Shoulder adduction    Shoulder internal rotation 88 90  Shoulder external rotation 65* 78  Elbow flexion    Elbow extension    Wrist flexion    Wrist extension    Wrist ulnar deviation    Wrist radial deviation    Wrist pronation    Wrist supination    (Blank rows = not tested)  UPPER EXTREMITY MMT:  MMT Right eval Left eval  Shoulder flexion 3+* 4  Shoulder extension    Shoulder abduction 3+* 4  Shoulder adduction    Shoulder internal rotation 4* 5  Shoulder external rotation 3+ 5  Middle trapezius    Lower trapezius    Elbow flexion 4 4  Elbow extension 3 4  Wrist flexion    Wrist extension    Wrist ulnar deviation    Wrist radial deviation    Wrist pronation    Wrist supination    Grip strength (lbs)    (Blank rows = not tested)  SHOULDER SPECIAL TESTS: Impingement tests: Hawkins/Kennedy impingement test: positive  Rotator cuff assessment: Empty can test: positive , Full can test: positive , and Infraspinatus test: positive   Scapular assist test: positive   CERVICAL SPECIAL TESTS: Spurling's: A and B negative   WRIST SPECIAL TESTS: Phalen's: negative bilat  JOINT MOBILITY TESTING:  GHJ AP/PA/inferior:  normal and painless in  all planes.  PALPATION:  TTP along long head of biceps tendon and supraspinatus tendon.                                                                                                                             TREATMENT DATE: 11/28/23  There.Ex:  Seated pulley's R shoulder flexion AAROM: 2x15. Discussion    Scap retractions with blue TB: 3x12. VC's for reducing shoulder extension and working on scap retraction for modification to R shoulder pain.     Hook lying with bolster under knees:    Serratus punches, 3x12, max TC's    Rhythmic stabilization RUE flexed at shoulder to 90 degrees: 3x30 sec at medial/lateral epicondyles.    Discussion of varying sleeping postures to attempt easing her pain. Discussed reducing head elevation, side sleeping, quarter side lying, and supine sleeping and use of pillows to comfort. Pt reports attempting all of these postures without success. ~5 minutes total   PATIENT EDUCATION: Education details: POC, prognosis, HEP Person educated: Patient Education method: Explanation, Demonstration, and Handouts Education comprehension: verbalized understanding and returned demonstration  HOME EXERCISE PROGRAM: Access Code: [email or web address] URL: [email or web address] Date: 11/07/2023 Prepared by: Ronnie Derby  Exercises - Seated Shoulder Flexion AAROM with Pulley Behind  - 1 x daily - 7 x weekly - 2 sets - 15 reps - Scapular Retraction with Resistance  - 1 x daily - 4-5 x weekly - 3 sets - 12 reps  ASSESSMENT:  CLINICAL IMPRESSION: Pt arriving for first treatment session. Time spent reviewing HEP and gentle periscapular strength training. Pt educated on varying HEP adjustments to limit R shoulder pain to desensitize shoulder and assist in pain free motion. Pt able to adjust to pain free ranges. Attempts made on discussing pain free sleeping postures but overall unsuccessful as pt has tried all these options discussed. Pt does  report no pain after today's session. These deficits are limiting pt in participation of overhead ADL's, cooking, sleeping, and bathing. Pt will benefit from skilled PT services to address these deficits and maximize functional capacity of RUE to PLOF.   OBJECTIVE IMPAIRMENTS: decreased mobility, decreased ROM, decreased strength, impaired UE functional use, postural dysfunction, and pain.   ACTIVITY LIMITATIONS: carrying, lifting, sleeping, bathing, reach over head, and hygiene/grooming  PARTICIPATION LIMITATIONS: cleaning, laundry, and shopping  PERSONAL FACTORS: Age, Past/current experiences, Profession, and Time since onset of injury/illness/exacerbation are also affecting patient's functional outcome.   REHAB POTENTIAL: Fair chronicity of symptoms  CLINICAL DECISION MAKING: Evolving/moderate complexity  EVALUATION COMPLEXITY: Moderate  GOALS: Goals reviewed with patient? No  SHORT TERM GOALS: Target date: 12/05/23  Pt will be independent with HEP to improve RUE pain and ROM for overhead ADL's Baseline: 2/18.25: provided initial HEP Goal status: INITIAL  LONG TERM GOALS: Target date: 01/02/24  Pt will improve QuickDash by at least 10 points to demonstrate clinically significant improvement in reduced disability due to RUE impairments.  Baseline: 11/07/23: 61.4% Goal status: INITIAL  2.  Pt will improve R shoulder flexion to at least 120 degrees to demonstrate clinically significant improvement in RUE use for overhead ADL's. Baseline: 11/07/23: 98 degrees Goal status: INITIAL  3.  Pt will report worst pain as a 6/10 NPS or less with cooking and other overhead ADL's to demonstrate clinically significant reduction in pain.  Baseline: 11/08/23: 8/10 NPS Goal status: INITIAL PLAN: PT FREQUENCY: 1-2x/week  PT DURATION: 8 weeks  PLANNED INTERVENTIONS: 97164- PT Re-evaluation, 97110-Therapeutic exercises, 97530- Therapeutic activity, 97112- Neuromuscular re-education, 97535- Self  Care, 91478- Manual therapy, [id number]- Electrical stimulation (unattended), [phone]- Electrical stimulation (manual), Patient/Family education, Dry Needling, Joint mobilization, Joint manipulation, Spinal manipulation, Spinal mobilization, Cryotherapy, and Moist heat  PLAN FOR NEXT SESSION: scapulohumeral rhythm, serratus anterior strengthening, periscapular and pain free RTC strengthening   Delphia Grates. Fairly IV, PT, DPT Physical Therapist- [place]  [facility]  11/28/2023, 9:54 AM

## 2023-12-01 ENCOUNTER — Ambulatory Visit: Payer: Medicare HMO

## 2023-12-01 DIAGNOSIS — G8929 Other chronic pain: Secondary | ICD-10-CM

## 2023-12-01 DIAGNOSIS — M25511 Pain in right shoulder: Secondary | ICD-10-CM | POA: Diagnosis not present

## 2023-12-01 DIAGNOSIS — M6281 Muscle weakness (generalized): Secondary | ICD-10-CM

## 2023-12-01 NOTE — Therapy (Signed)
 OUTPATIENT PHYSICAL THERAPY UPPER EXTREMITY TREATMENT   Patient Name: Martha Chambers MRN: [id number] DOB:Apr 11, 1951, 73 y.o., female Today's Date: 12/01/2023  END OF SESSION:  PT End of Session - 12/01/23 1036     Visit Number 3    Number of Visits 17    Date for PT Re-Evaluation 01/02/24    PT Start Time 1035    PT Stop Time 1115    PT Time Calculation (min) 40 min    Activity Tolerance Patient tolerated treatment well    Behavior During Therapy WFL for tasks assessed/performed             Past Medical History:  Diagnosis Date   Arthritis    GERD (gastroesophageal reflux disease)    Hypertension    Past Surgical History:  Procedure Laterality Date   ABDOMINAL HYSTERECTOMY     CHOLECYSTECTOMY     KNEE ARTHROPLASTY  07   rt   TONSILLECTOMY     TOTAL KNEE ARTHROPLASTY  09/21/2011   Procedure: TOTAL KNEE ARTHROPLASTY;  Surgeon: Loreta Ave, MD;  Location: [facility] OR;  Service: Orthopedics;  Laterality: Left;  120 MINUTES FOR THIS SURGERY   Patient Active Problem List   Diagnosis Date Noted   Bilateral primary osteoarthritis of knee 12/23/2021   Celiac disease 12/23/2021   Hyperglycemia 12/23/2021   Hyperlipidemia 12/23/2021   Insomnia 12/23/2021   Abnormal weight gain 10/28/2020   Change in bowel habit 10/28/2020   Diverticular disease of colon 10/28/2020   Diverticulosis of large intestine without perforation or abscess without bleeding 10/28/2020   Dysphagia 10/28/2020   Epigastric pain 10/28/2020   Periumbilical pain 10/28/2020   Morbid obesity (HCC) 10/28/2020   Nausea 10/28/2020   Right lower quadrant pain 10/28/2020   Pain due to onychomycosis of toenails of both feet 10/28/2020   HTN (hypertension) 08/07/2014   TIA (transient ischemic attack) 08/07/2014   GERD (gastroesophageal reflux disease)    Arthritis     PCP: Eleanora Neighbor MD  REFERRING PROVIDER: Hurman Horn, MD  REFERRING DIAG: M25.511 (ICD-10-CM) - Right shoulder pain  THERAPY  DIAG:  Chronic right shoulder pain  Muscle weakness (generalized)  Rationale for Evaluation and Treatment: Rehabilitation  ONSET DATE: fall almost 48 years ago  SUBJECTIVE:                                                                                                                                                                                      SUBJECTIVE STATEMENT: Pt reports making HEP adjustments with success. No pain currently. HEP feeling better.  Hand dominance: Right  PERTINENT HISTORY: Pt reports falling onto R shoulder during a pregnancy ~48  years ago at work. Has had prior bouts of PT and saw MD. Reports not a lot of success with PT. Her R shoulder when she was younger she had little to no issues. As she has gotten older it has bothered her more. Reports difficulty with sleep. Actually able to sleep on her R side. More issues with sleeping on her L side with shoulder propped on pillow. Unable to elevate her arm very high. Has weakness in RUE and pain. Described as dull and achey. Catching pain with overhead ranges. Has to use LUE to assist RUE primarily when her pain is bad. Has difficulty with doing her hair. Reports most difficulty with weighted ADL's like cooking and putting dishes away over head. Some difficulty reaching behind her back primarily with bathing ADL's. Does report hand numbness holding her phone. Has referred pain down lateral arm to her elbow. Reports similar pain in LUE thinking do to compensation for RUE. Reports worst pain upwards to 8-9/10 NPS at night. 3-4/10 NPS during the day with UE use.   PAIN:  Are you having pain? Yes: NPRS scale: 0/10 NPS Pain location: anterior and superior R shoulder pain Pain description: dull/achey/catching Aggravating factors: L side lying, overhead R shoulder ranges Relieving factors: Rest  PRECAUTIONS: None  RED FLAGS: None   WEIGHT BEARING RESTRICTIONS: No  FALLS:  Has patient fallen in last 6 months?  No  LIVING ENVIRONMENT: Lives with: lives with their spouse  OCCUPATION: Retired Char[employer] fundraiser  PLOF: Independent  PATIENT GOALS: Improve RUE strength, pain and AROM  NEXT MD VISIT: Within 1-2 months  OBJECTIVE:  Note: Objective measures were completed at Evaluation unless otherwise noted.  DIAGNOSTIC FINDINGS:  N/A  PATIENT SURVEYS :  Quick Dash 61.4%  COGNITION: Overall cognitive status: Within functional limits for tasks assessed     SENSATION: Light touch: WFL  POSTURE: Mildly rounded shoulders  CERVICAL AROM: Flexion: 55 Extension: 30* Rotation R/L: 35/50 Lateral flexion R/L: 25*/20  UPPER EXTREMITY ROM:   Active ROM Right eval Left eval  Shoulder flexion 98* (162 PROM) 135  Shoulder extension    Shoulder abduction 96* (140 PROM) 140  Shoulder adduction    Shoulder internal rotation 88 90  Shoulder external rotation 65* 78  Elbow flexion    Elbow extension    Wrist flexion    Wrist extension    Wrist ulnar deviation    Wrist radial deviation    Wrist pronation    Wrist supination    (Blank rows = not tested)  UPPER EXTREMITY MMT:  MMT Right eval Left eval  Shoulder flexion 3+* 4  Shoulder extension    Shoulder abduction 3+* 4  Shoulder adduction    Shoulder internal rotation 4* 5  Shoulder external rotation 3+ 5  Middle trapezius    Lower trapezius    Elbow flexion 4 4  Elbow extension 3 4  Wrist flexion    Wrist extension    Wrist ulnar deviation    Wrist radial deviation    Wrist pronation    Wrist supination    Grip strength (lbs)    (Blank rows = not tested)  SHOULDER SPECIAL TESTS: Impingement tests: Hawkins/Kennedy impingement test: positive  Rotator cuff assessment: Empty can test: positive , Full can test: positive , and Infraspinatus test: positive   Scapular assist test: positive   CERVICAL SPECIAL TESTS: Spurling's: A and B negative   WRIST SPECIAL TESTS: Phalen's: negative bilat  JOINT MOBILITY TESTING:  GHJ  AP/PA/inferior: normal and  painless in all planes.  PALPATION:  TTP along long head of biceps tendon and supraspinatus tendon.                                                                                                                             TREATMENT DATE: 12/01/23  There.Ex:  Seated pulley's R shoulder flexion AAROM: x20    Scap retractions with blue TB: 2x15. Good form/technique.    Hook lying with bolster under knees, RUE:    Serratus punches, 3x12, 2# DB   Rhythmic stabilization RUE flexed at shoulder to 90 degrees: 4x40 sec at medial/lateral epicondyles.     L side lying, R shoulder ER: 1x8, 1# DB. 2x5, no resistance. Min multi modal cuing for form/technique.    Seated B shoulder flexion AAROM with dowel: 3x8. VC's to maintain in pain free ranges with slowed, eccentric lowering.    Discussed HEP adjustments with side lying shoulder ER. Provided black TB for progressing scap retractions. New hand out provided.  PATIENT EDUCATION: Education details: POC, prognosis, HEP Person educated: Patient Education method: Explanation, Demonstration, and Handouts Education comprehension: verbalized understanding and returned demonstration  HOME EXERCISE PROGRAM: Access Code: [email or web address] URL: [email or web address] Date: 12/01/2023 Prepared by: Ronnie Derby  Exercises - Seated Shoulder Flexion AAROM with Pulley Behind  - 1 x daily - 7 x weekly - 2 sets - 15 reps - Scapular Retraction with Resistance  - 1 x daily - 7 x weekly - 3 sets - 12 reps - Sidelying Shoulder External Rotation  - 1 x daily - 3-4 x weekly - 3 sets - 5 reps  Access Code: [email or web address] URL: [email or web address] Date: 11/07/2023 Prepared by: Ronnie Derby  Exercises - Seated Shoulder Flexion AAROM with Pulley Behind  - 1 x daily - 7 x weekly - 2 sets - 15 reps - Scapular Retraction with Resistance  - 1 x daily - 4-5 x weekly - 3 sets - 12 reps  ASSESSMENT:  CLINICAL  IMPRESSION: Continuing PT POC working on R shoulder strengthening and AROM. Able to progress rhythmic stabilizations and serratus anterior strengthening without pain. Also addition of side lying shoulder ER using gravity for resistance. Pt markedly weak in shoulder ER with limited AROM as fatigue sets in. Future sessions to initiate sitting overhead strengthening in pain free ranges to improve RUE functional capacity for ADL's. These deficits are limiting pt in participation of overhead ADL's, cooking, sleeping, and bathing. Pt will benefit from skilled PT services to address these deficits and maximize functional capacity of RUE to PLOF.  OBJECTIVE IMPAIRMENTS: decreased mobility, decreased ROM, decreased strength, impaired UE functional use, postural dysfunction, and pain.   ACTIVITY LIMITATIONS: carrying, lifting, sleeping, bathing, reach over head, and hygiene/grooming  PARTICIPATION LIMITATIONS: cleaning, laundry, and shopping  PERSONAL FACTORS: Age, Past/current experiences, Profession, and Time since onset of injury/illness/exacerbation are also affecting patient's functional outcome.   REHAB POTENTIAL: Fair chronicity of symptoms  CLINICAL DECISION MAKING: Evolving/moderate complexity  EVALUATION  COMPLEXITY: Moderate  GOALS: Goals reviewed with patient? No  SHORT TERM GOALS: Target date: 12/05/23  Pt will be independent with HEP to improve RUE pain and ROM for overhead ADL's Baseline: 2/18.25: provided initial HEP Goal status: INITIAL  LONG TERM GOALS: Target date: 01/02/24  Pt will improve QuickDash by at least 10 points to demonstrate clinically significant improvement in reduced disability due to RUE impairments.  Baseline: 11/07/23: 61.4% Goal status: INITIAL  2.  Pt will improve R shoulder flexion to at least 120 degrees to demonstrate clinically significant improvement in RUE use for overhead ADL's. Baseline: 11/07/23: 98 degrees Goal status: INITIAL  3.  Pt will report  worst pain as a 6/10 NPS or less with cooking and other overhead ADL's to demonstrate clinically significant reduction in pain.  Baseline: 11/08/23: 8/10 NPS Goal status: INITIAL PLAN: PT FREQUENCY: 1-2x/week  PT DURATION: 8 weeks  PLANNED INTERVENTIONS: 97164- PT Re-evaluation, 97110-Therapeutic exercises, 97530- Therapeutic activity, 97112- Neuromuscular re-education, 97535- Self Care, [id number]- Manual therapy, [id number]- Electrical stimulation (unattended), [phone]- Electrical stimulation (manual), Patient/Family education, Dry Needling, Joint mobilization, Joint manipulation, Spinal manipulation, Spinal mobilization, Cryotherapy, and Moist heat  PLAN FOR NEXT SESSION: Trial UE range with R shoulder flexion. Seated scapulohumeral rhythm, serratus anterior strengthening, periscapular and pain free. RTC strengthening   Delphia Grates. Fairly IV, PT, DPT Physical Therapist- [place]  [facility]  12/01/2023, 11:43 AM

## 2023-12-05 ENCOUNTER — Ambulatory Visit: Payer: Medicare HMO

## 2023-12-05 DIAGNOSIS — M25511 Pain in right shoulder: Secondary | ICD-10-CM | POA: Diagnosis not present

## 2023-12-05 DIAGNOSIS — G8929 Other chronic pain: Secondary | ICD-10-CM

## 2023-12-05 DIAGNOSIS — M6281 Muscle weakness (generalized): Secondary | ICD-10-CM

## 2023-12-05 NOTE — Therapy (Signed)
 OUTPATIENT PHYSICAL THERAPY UPPER EXTREMITY TREATMENT   Patient Name: Martha Chambers MRN: [id number] DOB:Jul 25, 1951, 73 y.o., female Today's Date: 12/05/2023  END OF SESSION:  PT End of Session - 12/05/23 1024     Visit Number 4    Number of Visits 17    Date for PT Re-Evaluation 01/02/24    PT Start Time 0947    PT Stop Time 1023    PT Time Calculation (min) 36 min    Activity Tolerance Patient tolerated treatment well    Behavior During Therapy WFL for tasks assessed/performed             Past Medical History:  Diagnosis Date   Arthritis    GERD (gastroesophageal reflux disease)    Hypertension    Past Surgical History:  Procedure Laterality Date   ABDOMINAL HYSTERECTOMY     CHOLECYSTECTOMY     KNEE ARTHROPLASTY  07   rt   TONSILLECTOMY     TOTAL KNEE ARTHROPLASTY  09/21/2011   Procedure: TOTAL KNEE ARTHROPLASTY;  Surgeon: Loreta Ave, MD;  Location: [facility] OR;  Service: Orthopedics;  Laterality: Left;  120 MINUTES FOR THIS SURGERY   Patient Active Problem List   Diagnosis Date Noted   Bilateral primary osteoarthritis of knee 12/23/2021   Celiac disease 12/23/2021   Hyperglycemia 12/23/2021   Hyperlipidemia 12/23/2021   Insomnia 12/23/2021   Abnormal weight gain 10/28/2020   Change in bowel habit 10/28/2020   Diverticular disease of colon 10/28/2020   Diverticulosis of large intestine without perforation or abscess without bleeding 10/28/2020   Dysphagia 10/28/2020   Epigastric pain 10/28/2020   Periumbilical pain 10/28/2020   Morbid obesity (HCC) 10/28/2020   Nausea 10/28/2020   Right lower quadrant pain 10/28/2020   Pain due to onychomycosis of toenails of both feet 10/28/2020   HTN (hypertension) 08/07/2014   TIA (transient ischemic attack) 08/07/2014   GERD (gastroesophageal reflux disease)    Arthritis     PCP: Eleanora Neighbor MD  REFERRING PROVIDER: Hurman Horn, MD  REFERRING DIAG: M25.511 (ICD-10-CM) - Right shoulder pain  THERAPY  DIAG:  Chronic right shoulder pain  Muscle weakness (generalized)  Rationale for Evaluation and Treatment: Rehabilitation  ONSET DATE: fall almost 48 years ago  SUBJECTIVE:                                                                                                                                                                                      SUBJECTIVE STATEMENT: Pt reports compliance with HEP everyday. No pain currently. Reported mild improvement with ROM and pain. Hand dominance: Right  PERTINENT HISTORY: Pt reports falling onto R shoulder during a  pregnancy ~48 years ago at work. Has had prior bouts of PT and saw MD. Reports not a lot of success with PT. Her R shoulder when she was younger she had little to no issues. As she has gotten older it has bothered her more. Reports difficulty with sleep. Actually able to sleep on her R side. More issues with sleeping on her L side with shoulder propped on pillow. Unable to elevate her arm very high. Has weakness in RUE and pain. Described as dull and achey. Catching pain with overhead ranges. Has to use LUE to assist RUE primarily when her pain is bad. Has difficulty with doing her hair. Reports most difficulty with weighted ADL's like cooking and putting dishes away over head. Some difficulty reaching behind her back primarily with bathing ADL's. Does report hand numbness holding her phone. Has referred pain down lateral arm to her elbow. Reports similar pain in LUE thinking do to compensation for RUE. Reports worst pain upwards to 8-9/10 NPS at night. 3-4/10 NPS during the day with UE use.   PAIN:  Are you having pain? Yes: NPRS scale: 0/10 NPS Pain location: anterior and superior R shoulder pain Pain description: dull/achey/catching Aggravating factors: L side lying, overhead R shoulder ranges Relieving factors: Rest  PRECAUTIONS: None  RED FLAGS: None   WEIGHT BEARING RESTRICTIONS: No  FALLS:  Has patient fallen in last 6  months? No  LIVING ENVIRONMENT: Lives with: lives with their spouse  OCCUPATION: Retired Char[employer] fundraiser  PLOF: Independent  PATIENT GOALS: Improve RUE strength, pain and AROM  NEXT MD VISIT: Within 1-2 months  OBJECTIVE:  Note: Objective measures were completed at Evaluation unless otherwise noted.  DIAGNOSTIC FINDINGS:  N/A  PATIENT SURVEYS :  Quick Dash 61.4%  COGNITION: Overall cognitive status: Within functional limits for tasks assessed     SENSATION: Light touch: WFL  POSTURE: Mildly rounded shoulders  CERVICAL AROM: Flexion: 55 Extension: 30* Rotation R/L: 35/50 Lateral flexion R/L: 25*/20  UPPER EXTREMITY ROM:   Active ROM Right eval Left eval  Shoulder flexion 98* (162 PROM) 135  Shoulder extension    Shoulder abduction 96* (140 PROM) 140  Shoulder adduction    Shoulder internal rotation 88 90  Shoulder external rotation 65* 78  Elbow flexion    Elbow extension    Wrist flexion    Wrist extension    Wrist ulnar deviation    Wrist radial deviation    Wrist pronation    Wrist supination    (Blank rows = not tested)  UPPER EXTREMITY MMT:  MMT Right eval Left eval  Shoulder flexion 3+* 4  Shoulder extension    Shoulder abduction 3+* 4  Shoulder adduction    Shoulder internal rotation 4* 5  Shoulder external rotation 3+ 5  Middle trapezius    Lower trapezius    Elbow flexion 4 4  Elbow extension 3 4  Wrist flexion    Wrist extension    Wrist ulnar deviation    Wrist radial deviation    Wrist pronation    Wrist supination    Grip strength (lbs)    (Blank rows = not tested)  SHOULDER SPECIAL TESTS: Impingement tests: Hawkins/Kennedy impingement test: positive  Rotator cuff assessment: Empty can test: positive , Full can test: positive , and Infraspinatus test: positive   Scapular assist test: positive   CERVICAL SPECIAL TESTS: Spurling's: A and B negative   WRIST SPECIAL TESTS: Phalen's: negative bilat  JOINT MOBILITY TESTING:  GHJ  AP/PA/inferior:  normal and painless in all planes.  PALPATION:  TTP along long head of biceps tendon and supraspinatus tendon.                                                                                                                             TREATMENT DATE: 12/05/23  There.Ex:  Standing Shoulder Flexion AAROM against green ball with wall: 3x10  Standing Wall Circles at 90 Shoulder Flexion counter clock/clockwise direction: 2x10 ea direction Standing Low Rows: Black TB 3x12  Standing Posterior Deltoid Fly: Red TB 2x12  Standing Lateral Deltoid Raise to 30 degrees: 3x10 1#DB L side lying, R shoulder ER: 3x10, 1# DB. Min Cue for technique   There.act:   Seated Shoulder ER, Bolster Support at 90 abd., 3x10 1# DB  Seated Against Bolster Rhythmic Stabilization RUE flexed to 90: 3x30s at wrist.  Standing R shoulder flexion with 1# DB: 3x10 in pain free ranges. ~80-90 degrees.   PATIENT EDUCATION: Education details: POC, prognosis, HEP Person educated: Patient Education method: Explanation, Demonstration, and Handouts Education comprehension: verbalized understanding and returned demonstration  HOME EXERCISE PROGRAM: Access Code: [email or web address] URL: [email or web address] Date: 12/01/2023 Prepared by: Ronnie Derby  Exercises - Seated Shoulder Flexion AAROM with Pulley Behind  - 1 x daily - 7 x weekly - 2 sets - 15 reps - Scapular Retraction with Resistance  - 1 x daily - 7 x weekly - 3 sets - 12 reps - Sidelying Shoulder External Rotation  - 1 x daily - 3-4 x weekly - 3 sets - 5 reps  Access Code: [email or web address] URL: [email or web address] Date: 11/07/2023 Prepared by: Ronnie Derby  Exercises - Seated Shoulder Flexion AAROM with Pulley Behind  - 1 x daily - 7 x weekly - 2 sets - 15 reps - Scapular Retraction with Resistance  - 1 x daily - 4-5 x weekly - 3 sets - 12 reps  ASSESSMENT:  CLINICAL IMPRESSION: Pt reported to PT with main focus on  improving R shoulder strength and AROM. Progressed stabilization exercise and resistance to shoulder ER without additional report of pain. Progressed exercises to standing and sitting with good return demonstration. Next session to include continued progression of UE shoulder strength and stabilization exercise. These deficits are limiting pt in participation of overhead ADL's, cooking, sleeping, and bathing. Pt will benefit from skilled PT services to address these deficits and maximize functional capacity of RUE to PLOF.   OBJECTIVE IMPAIRMENTS: decreased mobility, decreased ROM, decreased strength, impaired UE functional use, postural dysfunction, and pain.   ACTIVITY LIMITATIONS: carrying, lifting, sleeping, bathing, reach over head, and hygiene/grooming  PARTICIPATION LIMITATIONS: cleaning, laundry, and shopping  PERSONAL FACTORS: Age, Past/current experiences, Profession, and Time since onset of injury/illness/exacerbation are also affecting patient's functional outcome.   REHAB POTENTIAL: Fair chronicity of symptoms  CLINICAL DECISION MAKING: Evolving/moderate complexity  EVALUATION COMPLEXITY: Moderate  GOALS: Goals reviewed with patient? No  SHORT TERM GOALS: Target date: 12/05/23  Pt will be independent with  HEP to improve RUE pain and ROM for overhead ADL's Baseline: 2/18.25: provided initial HEP; 12/05/23 Goal status: MET  LONG TERM GOALS: Target date: 01/02/24  Pt will improve QuickDash by at least 10 points to demonstrate clinically significant improvement in reduced disability due to RUE impairments.  Baseline: 11/07/23: 61.4% Goal status: INITIAL  2.  Pt will improve R shoulder flexion to at least 120 degrees to demonstrate clinically significant improvement in RUE use for overhead ADL's. Baseline: 11/07/23: 98 degrees Goal status: INITIAL  3.  Pt will report worst pain as a 6/10 NPS or less with cooking and other overhead ADL's to demonstrate clinically significant  reduction in pain.  Baseline: 11/08/23: 8/10 NPS Goal status: INITIAL PLAN: PT FREQUENCY: 1-2x/week  PT DURATION: 8 weeks  PLANNED INTERVENTIONS: 97164- PT Re-evaluation, 97110-Therapeutic exercises, 97530- Therapeutic activity, 97112- Neuromuscular re-education, 97535- Self Care, [id number]- Manual therapy, G0283- Electrical stimulation (unattended), [phone]- Electrical stimulation (manual), Patient/Family education, Dry Needling, Joint mobilization, Joint manipulation, Spinal manipulation, Spinal mobilization, Cryotherapy, and Moist heat  PLAN FOR NEXT SESSION: Assess response to seated and standing R shoulder AROM and strength. Progress or regress as appropriate.     Delphia Grates. Fairly IV, PT, DPT Physical Therapist- [place]  [facility]  12/05/2023, 11:02 AM

## 2023-12-18 ENCOUNTER — Ambulatory Visit: Payer: Medicare HMO

## 2023-12-18 DIAGNOSIS — M6281 Muscle weakness (generalized): Secondary | ICD-10-CM

## 2023-12-18 DIAGNOSIS — G8929 Other chronic pain: Secondary | ICD-10-CM

## 2023-12-18 DIAGNOSIS — M25511 Pain in right shoulder: Secondary | ICD-10-CM | POA: Diagnosis not present

## 2023-12-18 NOTE — Therapy (Signed)
 OUTPATIENT PHYSICAL THERAPY UPPER EXTREMITY TREATMENT   Patient Name: Martha Chambers MRN: [id number] DOB:[birth year], 73 y.o., female Today's Date: 12/18/2023  END OF SESSION:  PT End of Session - 12/18/23 0810     Visit Number 5    Number of Visits 17    Date for PT Re-Evaluation 01/02/24    PT Start Time 0813    PT Stop Time 0855    PT Time Calculation (min) 42 min    Activity Tolerance Patient tolerated treatment well    Behavior During Therapy [facility] for tasks assessed/performed             Past Medical History:  Diagnosis Date   Arthritis    GERD (gastroesophageal reflux disease)    Hypertension    Past Surgical History:  Procedure Laterality Date   ABDOMINAL HYSTERECTOMY     CHOLECYSTECTOMY     KNEE ARTHROPLASTY  07   rt   TONSILLECTOMY     TOTAL KNEE ARTHROPLASTY  09/21/2011   Procedure: TOTAL KNEE ARTHROPLASTY;  Surgeon: Loreta Ave, MD;  Location: [facility] OR;  Service: Orthopedics;  Laterality: Left;  120 MINUTES FOR THIS SURGERY   Patient Active Problem List   Diagnosis Date Noted   Bilateral primary osteoarthritis of knee 12/23/2021   Celiac disease 12/23/2021   Hyperglycemia 12/23/2021   Hyperlipidemia 12/23/2021   Insomnia 12/23/2021   Abnormal weight gain 10/28/2020   Change in bowel habit 10/28/2020   Diverticular disease of colon 10/28/2020   Diverticulosis of large intestine without perforation or abscess without bleeding 10/28/2020   Dysphagia 10/28/2020   Epigastric pain 10/28/2020   Periumbilical pain 10/28/2020   Morbid obesity (HCC) 10/28/2020   Nausea 10/28/2020   Right lower quadrant pain 10/28/2020   Pain due to onychomycosis of toenails of both feet 10/28/2020   HTN (hypertension) 08/07/2014   TIA (transient ischemic attack) 08/07/2014   GERD (gastroesophageal reflux disease)    Arthritis     PCP: Eleanora Neighbor MD  REFERRING PROVIDER: Hurman Horn, MD  REFERRING DIAG: M25.511 (ICD-10-CM) - Right shoulder pain  THERAPY  DIAG:  Chronic right shoulder pain  Muscle weakness (generalized)  Rationale for Evaluation and Treatment: Rehabilitation  ONSET DATE: fall almost 48 years ago  SUBJECTIVE:                                                                                                                                                                                      SUBJECTIVE STATEMENT: Patient reports improvements with pain and ROM. Reports compliance with HEP. She still reports mild pain with some ADLs at home.  Hand dominance: Right  PERTINENT HISTORY: Pt reports  falling onto R shoulder during a pregnancy ~48 years ago at work. Has had prior bouts of PT and saw MD. Reports not a lot of success with PT. Her R shoulder when she was younger she had little to no issues. As she has gotten older it has bothered her more. Reports difficulty with sleep. Actually able to sleep on her R side. More issues with sleeping on her L side with shoulder propped on pillow. Unable to elevate her arm very high. Has weakness in RUE and pain. Described as dull and achey. Catching pain with overhead ranges. Has to use LUE to assist RUE primarily when her pain is bad. Has difficulty with doing her hair. Reports most difficulty with weighted ADL's like cooking and putting dishes away over head. Some difficulty reaching behind her back primarily with bathing ADL's. Does report hand numbness holding her phone. Has referred pain down lateral arm to her elbow. Reports similar pain in LUE thinking do to compensation for RUE. Reports worst pain upwards to 8-9/10 NPS at night. 3-4/10 NPS during the day with UE use.   PAIN:  Are you having pain? Yes: NPRS scale: 0/10 NPS Pain location: anterior and superior R shoulder pain Pain description: dull/achey/catching Aggravating factors: L side lying, overhead R shoulder ranges Relieving factors: Rest  PRECAUTIONS: None  RED FLAGS: None   WEIGHT BEARING RESTRICTIONS: No  FALLS:  Has  patient fallen in last 6 months? No  LIVING ENVIRONMENT: Lives with: lives with their spouse  OCCUPATION: Retired Char[employer] fundraiser  PLOF: Independent  PATIENT GOALS: Improve RUE strength, pain and AROM  NEXT MD VISIT: Within 1-2 months  OBJECTIVE:  Note: Objective measures were completed at Evaluation unless otherwise noted.  DIAGNOSTIC FINDINGS:  N/A  PATIENT SURVEYS :  Quick Dash 61.4%  COGNITION: Overall cognitive status: Within functional limits for tasks assessed     SENSATION: Light touch: WFL  POSTURE: Mildly rounded shoulders  CERVICAL AROM: Flexion: 55 Extension: 30* Rotation R/L: 35/50 Lateral flexion R/L: 25*/20  UPPER EXTREMITY ROM:   Active ROM Right eval Left eval  Shoulder flexion 98* (162 PROM) 135  Shoulder extension    Shoulder abduction 96* (140 PROM) 140  Shoulder adduction    Shoulder internal rotation 88 90  Shoulder external rotation 65* 78  Elbow flexion    Elbow extension    Wrist flexion    Wrist extension    Wrist ulnar deviation    Wrist radial deviation    Wrist pronation    Wrist supination    (Blank rows = not tested)  UPPER EXTREMITY MMT:  MMT Right eval Left eval  Shoulder flexion 3+* 4  Shoulder extension    Shoulder abduction 3+* 4  Shoulder adduction    Shoulder internal rotation 4* 5  Shoulder external rotation 3+ 5  Middle trapezius    Lower trapezius    Elbow flexion 4 4  Elbow extension 3 4  Wrist flexion    Wrist extension    Wrist ulnar deviation    Wrist radial deviation    Wrist pronation    Wrist supination    Grip strength (lbs)    (Blank rows = not tested)  SHOULDER SPECIAL TESTS: Impingement tests: Hawkins/Kennedy impingement test: positive  Rotator cuff assessment: Empty can test: positive , Full can test: positive , and Infraspinatus test: positive   Scapular assist test: positive   CERVICAL SPECIAL TESTS: Spurling's: A and B negative   WRIST SPECIAL TESTS: Phalen's: negative bilat  JOINT  MOBILITY TESTING:  GHJ AP/PA/inferior: normal and painless in all planes.  PALPATION:  TTP along long head of biceps tendon and supraspinatus tendon.                                                                                                                             TREATMENT DATE: 12/18/23  There.Ex:   Seated Shoulder ER, Bolster Support at 90 abd., 3x10 2# DB   Standing Low Rows: Black TB: 3x12   Standing Posterior Deltoid Fly with Red TB 3 x 10    Standing DB Curl RUE: 1x10 3#, 2x10 4#  Seated Shoulder Retractions with Red TB: 3x10   Discussed updated HEP and educated patient on form/technique in order to improve shoulder ROM and strength.   There.act:   Seated Shoulder Flexion AAROM with SPC within pain free range (0-~60): 2 x 10 (HEP)   Standing Wall Circles at 90 Shoulder Flexion CCW/CW direction: 2x10 ea direction  Shoulder Press with PVC against wall RUE only, 4# AW attached at the end: 2x10  Shoulder Posterior Row with PVC against wall RUE only, 5# AW at the end: 3x10    PATIENT EDUCATION: Education details: POC, prognosis, HEP Person educated: Patient Education method: Explanation, Demonstration, and Handouts Education comprehension: verbalized understanding and returned demonstration  HOME EXERCISE PROGRAM: Access Code: [email or web address] URL: [email or web address] Date: 12/18/2023 Prepared by: Christene Lye  Exercises - Seated Shoulder Flexion AAROM with Pulley Behind  - 1 x daily - 7 x weekly - 2 sets - 15 reps - Scapular Retraction with Resistance  - 1 x daily - 7 x weekly - 3 sets - 12 reps - Sidelying Shoulder External Rotation  - 1 x daily - 3-4 x weekly - 3 sets - 5 reps - Seated Shoulder Flexion AAROM with Dowel  - 1 x daily - 7 x weekly - 3 sets - 10 reps - Standing Single Arm Bicep Curls Supinated with Dumbbell  - 1 x daily - 3-4 x weekly - 3 sets - 10 reps  Access Code: [email or web address] URL: [email or web address] Date:  12/01/2023 Prepared by: Ronnie Derby  Exercises - Seated Shoulder Flexion AAROM with Pulley Behind  - 1 x daily - 7 x weekly - 2 sets - 15 reps - Scapular Retraction with Resistance  - 1 x daily - 7 x weekly - 3 sets - 12 reps - Sidelying Shoulder External Rotation  - 1 x daily - 3-4 x weekly - 3 sets - 5 reps  ASSESSMENT:  CLINICAL IMPRESSION: Pt reported to PT with main focus on improving R shoulder strength and AROM. Pt tolerated additional exercises focused on shoulder stability and strength in standing. Mild pain reported with AROM at beginning of session; improved following exercises. She continues to demonstrate steady improvements with shoulder strength. Updated HEP to include additional AAROM and resistive exercises. Plan to continued progression of UE shoulder strength and stabilization exercise. These deficits are limiting pt in participation of overhead  ADL's, cooking, sleeping, and bathing. Pt will benefit from skilled PT services to address these deficits and maximize functional capacity of RUE to PLOF.   OBJECTIVE IMPAIRMENTS: decreased mobility, decreased ROM, decreased strength, impaired UE functional use, postural dysfunction, and pain.   ACTIVITY LIMITATIONS: carrying, lifting, sleeping, bathing, reach over head, and hygiene/grooming  PARTICIPATION LIMITATIONS: cleaning, laundry, and shopping  PERSONAL FACTORS: Age, Past/current experiences, Profession, and Time since onset of injury/illness/exacerbation are also affecting patient's functional outcome.   REHAB POTENTIAL: Fair chronicity of symptoms  CLINICAL DECISION MAKING: Evolving/moderate complexity  EVALUATION COMPLEXITY: Moderate  GOALS: Goals reviewed with patient? No  SHORT TERM GOALS: Target date: 12/05/23  Pt will be independent with HEP to improve RUE pain and ROM for overhead ADL's Baseline: 2/18.25: provided initial HEP; 12/05/23 Goal status: MET  LONG TERM GOALS: Target date: 01/02/24  Pt will  improve QuickDash by at least 10 points to demonstrate clinically significant improvement in reduced disability due to RUE impairments.  Baseline: 11/07/23: 61.4% Goal status: INITIAL  2.  Pt will improve R shoulder flexion to at least 120 degrees to demonstrate clinically significant improvement in RUE use for overhead ADL's. Baseline: 11/07/23: 98 degrees Goal status: INITIAL  3.  Pt will report worst pain as a 6/10 NPS or less with cooking and other overhead ADL's to demonstrate clinically significant reduction in pain.  Baseline: 11/08/23: 8/10 NPS Goal status: INITIAL PLAN: PT FREQUENCY: 1-2x/week  PT DURATION: 8 weeks  PLANNED INTERVENTIONS: 97164- PT Re-evaluation, 97110-Therapeutic exercises, 97530- Therapeutic activity, 97112- Neuromuscular re-education, 97535- Self Care, 16109- Manual therapy, G0283- Electrical stimulation (unattended), [phone]- Electrical stimulation (manual), Patient/Family education, Dry Needling, Joint mobilization, Joint manipulation, Spinal manipulation, Spinal mobilization, Cryotherapy, and Moist heat  PLAN FOR NEXT SESSION: Assess response to seated and standing R shoulder AROM and strength. Progress or regress as appropriate.     Christene Lye PT, DPT Physical Therapist- [facility]  12/18/2023, 9:05 AM

## 2023-12-21 ENCOUNTER — Ambulatory Visit: Payer: Medicare HMO | Attending: Sports Medicine

## 2023-12-21 DIAGNOSIS — M6281 Muscle weakness (generalized): Secondary | ICD-10-CM | POA: Diagnosis present

## 2023-12-21 DIAGNOSIS — M25511 Pain in right shoulder: Secondary | ICD-10-CM | POA: Insufficient documentation

## 2023-12-21 DIAGNOSIS — G8929 Other chronic pain: Secondary | ICD-10-CM | POA: Diagnosis present

## 2023-12-21 NOTE — Therapy (Addendum)
 OUTPATIENT PHYSICAL THERAPY UPPER EXTREMITY TREATMENT   Patient Name: Martha Chambers MRN: [id number] DOB:[birth year], 73 y.o., female Today's Date: 12/21/2023  END OF SESSION:  PT End of Session - 12/21/23 0807     Visit Number 6    Number of Visits 17    Date for PT Re-Evaluation 01/02/24    PT Start Time 0810    PT Stop Time 0850    PT Time Calculation (min) 40 min    Activity Tolerance Patient tolerated treatment well    Behavior During Therapy [facility] for tasks assessed/performed             Past Medical History:  Diagnosis Date   Arthritis    GERD (gastroesophageal reflux disease)    Hypertension    Past Surgical History:  Procedure Laterality Date   ABDOMINAL HYSTERECTOMY     CHOLECYSTECTOMY     KNEE ARTHROPLASTY  07   rt   TONSILLECTOMY     TOTAL KNEE ARTHROPLASTY  09/21/2011   Procedure: TOTAL KNEE ARTHROPLASTY;  Surgeon: Loreta Ave, MD;  Location: [facility] OR;  Service: Orthopedics;  Laterality: Left;  120 MINUTES FOR THIS SURGERY   Patient Active Problem List   Diagnosis Date Noted   Bilateral primary osteoarthritis of knee 12/23/2021   Celiac disease 12/23/2021   Hyperglycemia 12/23/2021   Hyperlipidemia 12/23/2021   Insomnia 12/23/2021   Abnormal weight gain 10/28/2020   Change in bowel habit 10/28/2020   Diverticular disease of colon 10/28/2020   Diverticulosis of large intestine without perforation or abscess without bleeding 10/28/2020   Dysphagia 10/28/2020   Epigastric pain 10/28/2020   Periumbilical pain 10/28/2020   Morbid obesity (HCC) 10/28/2020   Nausea 10/28/2020   Right lower quadrant pain 10/28/2020   Pain due to onychomycosis of toenails of both feet 10/28/2020   HTN (hypertension) 08/07/2014   TIA (transient ischemic attack) 08/07/2014   GERD (gastroesophageal reflux disease)    Arthritis     PCP: Eleanora Neighbor MD  REFERRING PROVIDER: Hurman Horn, MD  REFERRING DIAG: M25.511 (ICD-10-CM) - Right shoulder pain  THERAPY  DIAG:  Chronic right shoulder pain  Muscle weakness (generalized)  Rationale for Evaluation and Treatment: Rehabilitation  ONSET DATE: fall almost 48 years ago  SUBJECTIVE:                                                                                                                                                                                      SUBJECTIVE STATEMENT: Patient reports continued improvements with pain. No questions or concerns.   Hand dominance: Right  PERTINENT HISTORY: Pt reports falling onto R shoulder during a pregnancy ~48 years ago  at work. Has had prior bouts of PT and saw MD. Reports not a lot of success with PT. Her R shoulder when she was younger she had little to no issues. As she has gotten older it has bothered her more. Reports difficulty with sleep. Actually able to sleep on her R side. More issues with sleeping on her L side with shoulder propped on pillow. Unable to elevate her arm very high. Has weakness in RUE and pain. Described as dull and achey. Catching pain with overhead ranges. Has to use LUE to assist RUE primarily when her pain is bad. Has difficulty with doing her hair. Reports most difficulty with weighted ADL's like cooking and putting dishes away over head. Some difficulty reaching behind her back primarily with bathing ADL's. Does report hand numbness holding her phone. Has referred pain down lateral arm to her elbow. Reports similar pain in LUE thinking do to compensation for RUE. Reports worst pain upwards to 8-9/10 NPS at night. 3-4/10 NPS during the day with UE use.   PAIN:  Are you having pain? Yes: NPRS scale: 0/10 NPS Pain location: anterior and superior R shoulder pain Pain description: dull/achey/catching Aggravating factors: L side lying, overhead R shoulder ranges Relieving factors: Rest  PRECAUTIONS: None  RED FLAGS: None   WEIGHT BEARING RESTRICTIONS: No  FALLS:  Has patient fallen in last 6 months? No  LIVING  ENVIRONMENT: Lives with: lives with their spouse  OCCUPATION: Retired Char[employer] fundraiser  PLOF: Independent  PATIENT GOALS: Improve RUE strength, pain and AROM  NEXT MD VISIT: Within 1-2 months  OBJECTIVE:  Note: Objective measures were completed at Evaluation unless otherwise noted.  DIAGNOSTIC FINDINGS:  N/A  PATIENT SURVEYS :  Quick Dash 61.4%  COGNITION: Overall cognitive status: Within functional limits for tasks assessed     SENSATION: Light touch: WFL  POSTURE: Mildly rounded shoulders  CERVICAL AROM: Flexion: 55 Extension: 30* Rotation R/L: 35/50 Lateral flexion R/L: 25*/20  UPPER EXTREMITY ROM:   Active ROM Right eval Left eval  Shoulder flexion 98* (162 PROM) 135  Shoulder extension    Shoulder abduction 96* (140 PROM) 140  Shoulder adduction    Shoulder internal rotation 88 90  Shoulder external rotation 65* 78  Elbow flexion    Elbow extension    Wrist flexion    Wrist extension    Wrist ulnar deviation    Wrist radial deviation    Wrist pronation    Wrist supination    (Blank rows = not tested)  UPPER EXTREMITY MMT:  MMT Right eval Left eval  Shoulder flexion 3+* 4  Shoulder extension    Shoulder abduction 3+* 4  Shoulder adduction    Shoulder internal rotation 4* 5  Shoulder external rotation 3+ 5  Middle trapezius    Lower trapezius    Elbow flexion 4 4  Elbow extension 3 4  Wrist flexion    Wrist extension    Wrist ulnar deviation    Wrist radial deviation    Wrist pronation    Wrist supination    Grip strength (lbs)    (Blank rows = not tested)  SHOULDER SPECIAL TESTS: Impingement tests: Hawkins/Kennedy impingement test: positive  Rotator cuff assessment: Empty can test: positive , Full can test: positive , and Infraspinatus test: positive   Scapular assist test: positive   CERVICAL SPECIAL TESTS: Spurling's: A and B negative   WRIST SPECIAL TESTS: Phalen's: negative bilat  JOINT MOBILITY TESTING:  GHJ AP/PA/inferior:  normal and painless in  all planes.  PALPATION:  TTP along long head of biceps tendon and supraspinatus tendon.                                                                                                                             TREATMENT DATE: 12/21/23  There.Ex:   Standing Posterior Deltoid Fly with RTB 3x10;   Standing ER RUE with towel supporting elbow, RTB 3x10; standing rest break in between, multi modal cues for shoulder ER rather than torso rotation.   OMEGA: Low Row 2 x 8 10#   Lat Pull Down 2 x 8 15#   There.act:   Standing Bilateral Shoulder Flexion on Foam Roller, AAROM 2 x 10 in order to improve flexion ROM;   Seated against bolster D1 Flexion Pattern RUE only 3 x 10; minor pain in anterior shoulder   PT demo and education on door way stretch in order to reduce pain and tension in anterior shoulder 3 x 30s; VC for neutral torso positioning with stretch.   Manual Therapy:  GHJ AP Grade II Mobilization at 90 Shoulder abd, 30s/bout 4 bouts  in order to improve joint ROM and pain modulation    GHJ Inf Grade III Mobilization at 90 Shoulder abd, 30s/bout , 4 bouts in order to improve joint ROM and pain modulation   Light STM to anterior deltoid, right upper pectoral and right proximal biceps for pain modulation and release tension   PATIENT EDUCATION: Education details: POC, prognosis, HEP Person educated: Patient Education method: Explanation, Demonstration, and Handouts Education comprehension: verbalized understanding and returned demonstration  HOME EXERCISE PROGRAM: Access Code: [email or web address] URL: [email or web address] Date: 12/18/2023 Prepared by: Christene Lye  Exercises - Seated Shoulder Flexion AAROM with Pulley Behind  - 1 x daily - 7 x weekly - 2 sets - 15 reps - Scapular Retraction with Resistance  - 1 x daily - 7 x weekly - 3 sets - 12 reps - Sidelying Shoulder External Rotation  - 1 x daily - 3-4 x weekly - 3 sets - 5 reps -  Seated Shoulder Flexion AAROM with Dowel  - 1 x daily - 7 x weekly - 3 sets - 10 reps - Standing Single Arm Bicep Curls Supinated with Dumbbell  - 1 x daily - 3-4 x weekly - 3 sets - 10 reps  Access Code: [email or web address] URL: [email or web address] Date: 12/01/2023 Prepared by: Ronnie Derby  Exercises - Seated Shoulder Flexion AAROM with Pulley Behind  - 1 x daily - 7 x weekly - 2 sets - 15 reps - Scapular Retraction with Resistance  - 1 x daily - 7 x weekly - 3 sets - 12 reps - Sidelying Shoulder External Rotation  - 1 x daily - 3-4 x weekly - 3 sets - 5 reps  ASSESSMENT:  CLINICAL IMPRESSION: Pt reported to PT with main focus on improving R shoulder strength and AROM. Pt tolerated all exercises in today's session without report of additional pain. Time  spent on passive techniques in order to improve shoulder flexion/abduction ROM. PT plans to continue progression of UE shoulder strength and ROM. hese deficits are limiting pt in participation of overhead ADL's, cooking, sleeping, and bathing. Pt will benefit from skilled PT services to address these deficits and maximize functional capacity of RUE to PLOF.  OBJECTIVE IMPAIRMENTS: decreased mobility, decreased ROM, decreased strength, impaired UE functional use, postural dysfunction, and pain.   ACTIVITY LIMITATIONS: carrying, lifting, sleeping, bathing, reach over head, and hygiene/grooming  PARTICIPATION LIMITATIONS: cleaning, laundry, and shopping  PERSONAL FACTORS: Age, Past/current experiences, Profession, and Time since onset of injury/illness/exacerbation are also affecting patient's functional outcome.   REHAB POTENTIAL: Fair chronicity of symptoms  CLINICAL DECISION MAKING: Evolving/moderate complexity  EVALUATION COMPLEXITY: Moderate  GOALS: Goals reviewed with patient? No  SHORT TERM GOALS: Target date: 12/05/23  Pt will be independent with HEP to improve RUE pain and ROM for overhead ADL's Baseline: 2/18.25:  provided initial HEP; 12/05/23 Goal status: MET  LONG TERM GOALS: Target date: 01/02/24  Pt will improve QuickDash by at least 10 points to demonstrate clinically significant improvement in reduced disability due to RUE impairments.  Baseline: 11/07/23: 61.4% Goal status: INITIAL  2.  Pt will improve R shoulder flexion to at least 120 degrees to demonstrate clinically significant improvement in RUE use for overhead ADL's. Baseline: 11/07/23: 98 degrees Goal status: INITIAL  3.  Pt will report worst pain as a 6/10 NPS or less with cooking and other overhead ADL's to demonstrate clinically significant reduction in pain.  Baseline: 11/08/23: 8/10 NPS Goal status: INITIAL PLAN: PT FREQUENCY: 1-2x/week  PT DURATION: 8 weeks  PLANNED INTERVENTIONS: 97164- PT Re-evaluation, 97110-Therapeutic exercises, 97530- Therapeutic activity, 97112- Neuromuscular re-education, 97535- Self Care, 16109- Manual therapy, [id number]- Electrical stimulation (unattended), [phone]- Electrical stimulation (manual), Patient/Family education, Dry Needling, Joint mobilization, Joint manipulation, Spinal manipulation, Spinal mobilization, Cryotherapy, and Moist heat  PLAN FOR NEXT SESSION: Progression from seated to standing exercise for R shoulder. Manual mobilization PRN for pain.     Christene Lye PT, DPT Physical Therapist- [facility]  12/21/2023, 9:01 AM

## 2023-12-25 ENCOUNTER — Ambulatory Visit: Payer: Medicare HMO

## 2023-12-25 DIAGNOSIS — G8929 Other chronic pain: Secondary | ICD-10-CM

## 2023-12-25 DIAGNOSIS — M6281 Muscle weakness (generalized): Secondary | ICD-10-CM

## 2023-12-25 DIAGNOSIS — M25511 Pain in right shoulder: Secondary | ICD-10-CM | POA: Diagnosis not present

## 2023-12-25 NOTE — Therapy (Signed)
 OUTPATIENT PHYSICAL THERAPY UPPER EXTREMITY TREATMENT   Patient Name: Martha Chambers MRN: [id number] DOB:1951/07/15, 73 y.o., female Today's Date: 12/25/2023  END OF SESSION:  PT End of Session - 12/25/23 0946     Visit Number 7    Number of Visits 17    Date for PT Re-Evaluation 01/02/24    PT Start Time 0945    PT Stop Time 1025    PT Time Calculation (min) 40 min    Activity Tolerance Patient tolerated treatment well    Behavior During Therapy WFL for tasks assessed/performed             Past Medical History:  Diagnosis Date   Arthritis    GERD (gastroesophageal reflux disease)    Hypertension    Past Surgical History:  Procedure Laterality Date   ABDOMINAL HYSTERECTOMY     CHOLECYSTECTOMY     KNEE ARTHROPLASTY  07   rt   TONSILLECTOMY     TOTAL KNEE ARTHROPLASTY  09/21/2011   Procedure: TOTAL KNEE ARTHROPLASTY;  Surgeon: Loreta Ave, MD;  Location: [facility] OR;  Service: Orthopedics;  Laterality: Left;  120 MINUTES FOR THIS SURGERY   Patient Active Problem List   Diagnosis Date Noted   Bilateral primary osteoarthritis of knee 12/23/2021   Celiac disease 12/23/2021   Hyperglycemia 12/23/2021   Hyperlipidemia 12/23/2021   Insomnia 12/23/2021   Abnormal weight gain 10/28/2020   Change in bowel habit 10/28/2020   Diverticular disease of colon 10/28/2020   Diverticulosis of large intestine without perforation or abscess without bleeding 10/28/2020   Dysphagia 10/28/2020   Epigastric pain 10/28/2020   Periumbilical pain 10/28/2020   Morbid obesity (HCC) 10/28/2020   Nausea 10/28/2020   Right lower quadrant pain 10/28/2020   Pain due to onychomycosis of toenails of both feet 10/28/2020   HTN (hypertension) 08/07/2014   TIA (transient ischemic attack) 08/07/2014   GERD (gastroesophageal reflux disease)    Arthritis     PCP: Eleanora Neighbor MD  REFERRING PROVIDER: Hurman Horn, MD  REFERRING DIAG: M25.511 (ICD-10-CM) - Right shoulder pain  THERAPY  DIAG:  Chronic right shoulder pain  Muscle weakness (generalized)  Rationale for Evaluation and Treatment: Rehabilitation  ONSET DATE: fall almost 48 years ago  SUBJECTIVE:                                                                                                                                                                                      SUBJECTIVE STATEMENT: Patient reports improvements with ROM and pain. No soreness after last session. No questions or concerns.  Hand dominance: Right  PERTINENT HISTORY: Pt reports falling onto R shoulder during  a pregnancy ~48 years ago at work. Has had prior bouts of PT and saw MD. Reports not a lot of success with PT. Her R shoulder when she was younger she had little to no issues. As she has gotten older it has bothered her more. Reports difficulty with sleep. Actually able to sleep on her R side. More issues with sleeping on her L side with shoulder propped on pillow. Unable to elevate her arm very high. Has weakness in RUE and pain. Described as dull and achey. Catching pain with overhead ranges. Has to use LUE to assist RUE primarily when her pain is bad. Has difficulty with doing her hair. Reports most difficulty with weighted ADL's like cooking and putting dishes away over head. Some difficulty reaching behind her back primarily with bathing ADL's. Does report hand numbness holding her phone. Has referred pain down lateral arm to her elbow. Reports similar pain in LUE thinking do to compensation for RUE. Reports worst pain upwards to 8-9/10 NPS at night. 3-4/10 NPS during the day with UE use.   PAIN:  Are you having pain? Yes: NPRS scale: 0/10 NPS Pain location: anterior and superior R shoulder pain Pain description: dull/achey/catching Aggravating factors: L side lying, overhead R shoulder ranges Relieving factors: Rest  PRECAUTIONS: None  RED FLAGS: None   WEIGHT BEARING RESTRICTIONS: No  FALLS:  Has patient fallen in last 6  months? No  LIVING ENVIRONMENT: Lives with: lives with their spouse  OCCUPATION: Retired Char[employer] fundraiser  PLOF: Independent  PATIENT GOALS: Improve RUE strength, pain and AROM  NEXT MD VISIT: Within 1-2 months  OBJECTIVE:  Note: Objective measures were completed at Evaluation unless otherwise noted.  DIAGNOSTIC FINDINGS:  N/A  PATIENT SURVEYS :  Quick Dash 61.4%  COGNITION: Overall cognitive status: Within functional limits for tasks assessed     SENSATION: Light touch: WFL  POSTURE: Mildly rounded shoulders  CERVICAL AROM: Flexion: 55 Extension: 30* Rotation R/L: 35/50 Lateral flexion R/L: 25*/20  UPPER EXTREMITY ROM:   Active ROM Right eval Left eval  Shoulder flexion 98* (162 PROM) 135  Shoulder extension    Shoulder abduction 96* (140 PROM) 140  Shoulder adduction    Shoulder internal rotation 88 90  Shoulder external rotation 65* 78  Elbow flexion    Elbow extension    Wrist flexion    Wrist extension    Wrist ulnar deviation    Wrist radial deviation    Wrist pronation    Wrist supination    (Blank rows = not tested)  UPPER EXTREMITY MMT:  MMT Right eval Left eval  Shoulder flexion 3+* 4  Shoulder extension    Shoulder abduction 3+* 4  Shoulder adduction    Shoulder internal rotation 4* 5  Shoulder external rotation 3+ 5  Middle trapezius    Lower trapezius    Elbow flexion 4 4  Elbow extension 3 4  Wrist flexion    Wrist extension    Wrist ulnar deviation    Wrist radial deviation    Wrist pronation    Wrist supination    Grip strength (lbs)    (Blank rows = not tested)  SHOULDER SPECIAL TESTS: Impingement tests: Hawkins/Kennedy impingement test: positive  Rotator cuff assessment: Empty can test: positive , Full can test: positive , and Infraspinatus test: positive   Scapular assist test: positive   CERVICAL SPECIAL TESTS: Spurling's: A and B negative   WRIST SPECIAL TESTS: Phalen's: negative bilat  JOINT MOBILITY TESTING:  GHJ  AP/PA/inferior: normal and painless in all planes.  PALPATION:  TTP along long head of biceps tendon and supraspinatus tendon.                                                                                                                             TREATMENT DATE: 12/25/23  There.Ex:   Standing Posterior Deltoid Fly with RTB 3x10;   Standing Shoulder Extension against Black TB 2 x 10;   Standing ER RUE with towel supporting elbow, RTB 3x10 in order to strengthen rotator cuff muscles;    Standing Tricep Extension with Blue TB 3 x 10;   OMEGA: Low Row 2 x 8 20#   Lat Pull Down 2 x 8 20#, VC for slowed tempo.    There.act:   Standing Bilateral Elbow Flexion with focus on eccentric phase against RTB 1 x 10; GTB 1 x 10; Blue TB 1 x 10. VC for slowed eccentric   Seated against Bolster, Shoulder Rhythmic Stabilization at 90 Flexion, Perturbations at wrist 2 x 30s; in order to improve shoulder muscle endurance,   Seated against bolster D1 Flexion Pattern RUE with 1# DB 2 x 10;  Manual Therapy (10 min) applied gentle shoulder oscillations in between bouts:   GHJ AP Grade II Mobilization at 90 Shoulder abd, 30s/bout 4 bouts  in order to improve joint ROM and pain modulation    GHJ Inf Grade III Mobilization at 90 Shoulder abd, 30s/bout , 4 bouts in order to improve joint ROM and pain modulation   Passive Shoulder Flexion Stretch with PT OP Elbow at 90 3 x 30s   Passive Shoulder Abd Stretch, shoulder at 90 abd. with PT OP 3 x 30s  PATIENT EDUCATION: Education details: POC, prognosis, HEP Person educated: Patient Education method: Explanation, Demonstration, and Handouts Education comprehension: verbalized understanding and returned demonstration  HOME EXERCISE PROGRAM: Access Code: [email or web address] URL: [email or web address] Date: 12/18/2023 Prepared by: Christene Lye  Exercises - Seated Shoulder Flexion AAROM with Pulley Behind  - 1 x daily - 7 x weekly - 2  sets - 15 reps - Scapular Retraction with Resistance  - 1 x daily - 7 x weekly - 3 sets - 12 reps - Sidelying Shoulder External Rotation  - 1 x daily - 3-4 x weekly - 3 sets - 5 reps - Seated Shoulder Flexion AAROM with Dowel  - 1 x daily - 7 x weekly - 3 sets - 10 reps - Standing Single Arm Bicep Curls Supinated with Dumbbell  - 1 x daily - 3-4 x weekly - 3 sets - 10 reps  Access Code: [email or web address] URL: [email or web address] Date: 12/01/2023 Prepared by: Ronnie Derby  Exercises - Seated Shoulder Flexion AAROM with Pulley Behind  - 1 x daily - 7 x weekly - 2 sets - 15 reps - Scapular Retraction with Resistance  - 1 x daily - 7 x weekly - 3 sets - 12 reps - Sidelying Shoulder External Rotation  -  1 x daily - 3-4 x weekly - 3 sets - 5 reps  ASSESSMENT:  CLINICAL IMPRESSION: Pt reported to OPPT with main focus on improving R shoulder strength and AROM. Patient tolerated additional exercise targeting elbow and shoulder strength. No report of additional pain with increased resistance at [facility] machine. Pt also demonstrated improvements with shoulder flexion and and D1 pattern with dumbbell without pain. Pt continues to show progress with shoulder flexion/abd ROM. PT encouraged adherence with HEP throughout week. Pt continues to present with deficits in full ROM of shoulder abd/flex. limiting pt in participation of overhead ADL's, cooking, sleeping, and bathing. Pt will benefit from skilled PT services to address these deficits and maximize functional capacity of RUE to PLOF.  OBJECTIVE IMPAIRMENTS: decreased mobility, decreased ROM, decreased strength, impaired UE functional use, postural dysfunction, and pain.   ACTIVITY LIMITATIONS: carrying, lifting, sleeping, bathing, reach over head, and hygiene/grooming  PARTICIPATION LIMITATIONS: cleaning, laundry, and shopping  PERSONAL FACTORS: Age, Past/current experiences, Profession, and Time since onset of injury/illness/exacerbation are  also affecting patient's functional outcome.   REHAB POTENTIAL: Fair chronicity of symptoms  CLINICAL DECISION MAKING: Evolving/moderate complexity  EVALUATION COMPLEXITY: Moderate  GOALS: Goals reviewed with patient? No  SHORT TERM GOALS: Target date: 12/05/23  Pt will be independent with HEP to improve RUE pain and ROM for overhead ADL's Baseline: 2/18.25: provided initial HEP; 12/05/23 Goal status: MET  LONG TERM GOALS: Target date: 01/02/24  Pt will improve QuickDash by at least 10 points to demonstrate clinically significant improvement in reduced disability due to RUE impairments.  Baseline: 11/07/23: 61.4% Goal status: INITIAL  2.  Pt will improve R shoulder flexion to at least 120 degrees to demonstrate clinically significant improvement in RUE use for overhead ADL's. Baseline: 11/07/23: 98 degrees Goal status: INITIAL  3.  Pt will report worst pain as a 6/10 NPS or less with cooking and other overhead ADL's to demonstrate clinically significant reduction in pain.  Baseline: 11/08/23: 8/10 NPS Goal status: INITIAL PLAN: PT FREQUENCY: 1-2x/week  PT DURATION: 8 weeks  PLANNED INTERVENTIONS: 97164- PT Re-evaluation, 97110-Therapeutic exercises, 97530- Therapeutic activity, 97112- Neuromuscular re-education, 97535- Self Care, 29562- Manual therapy, G0283- Electrical stimulation (unattended), [phone]- Electrical stimulation (manual), Patient/Family education, Dry Needling, Joint mobilization, Joint manipulation, Spinal manipulation, Spinal mobilization, Cryotherapy, and Moist heat  PLAN FOR NEXT SESSION: Progression from seated to standing exercise for R shoulder. Manual mobilization PRN for pain. D1 patterns, elbow resistive exercises.     Christene Lye PT, DPT Physical Therapist- [facility]  12/25/2023, 9:46 AM

## 2023-12-28 ENCOUNTER — Ambulatory Visit: Payer: Medicare HMO

## 2024-01-01 ENCOUNTER — Ambulatory Visit: Payer: Medicare HMO

## 2024-01-01 DIAGNOSIS — M25511 Pain in right shoulder: Secondary | ICD-10-CM | POA: Diagnosis not present

## 2024-01-01 DIAGNOSIS — G8929 Other chronic pain: Secondary | ICD-10-CM

## 2024-01-01 DIAGNOSIS — M6281 Muscle weakness (generalized): Secondary | ICD-10-CM

## 2024-01-01 NOTE — Therapy (Addendum)
 OUTPATIENT PHYSICAL THERAPY UPPER EXTREMITY TREATMENT/RE-CERTIFICATION    Patient Name: Martha Chambers MRN: [id number] DOB:1950-11-12, 73 y.o., female Today's Date: 01/01/2024  END OF SESSION:  PT End of Session - 01/01/24 0947     Visit Number 8    Number of Visits 25    Date for PT Re-Evaluation 01/29/24    PT Start Time 0945    PT Stop Time 1025    PT Time Calculation (min) 40 min    Activity Tolerance Patient tolerated treatment well    Behavior During Therapy WFL for tasks assessed/performed             Past Medical History:  Diagnosis Date   Arthritis    GERD (gastroesophageal reflux disease)    Hypertension    Past Surgical History:  Procedure Laterality Date   ABDOMINAL HYSTERECTOMY     CHOLECYSTECTOMY     KNEE ARTHROPLASTY  07   rt   TONSILLECTOMY     TOTAL KNEE ARTHROPLASTY  09/21/2011   Procedure: TOTAL KNEE ARTHROPLASTY;  Surgeon: Ferd Householder, MD;  Location: [facility] OR;  Service: Orthopedics;  Laterality: Left;  120 MINUTES FOR THIS SURGERY   Patient Active Problem List   Diagnosis Date Noted   Bilateral primary osteoarthritis of knee 12/23/2021   Celiac disease 12/23/2021   Hyperglycemia 12/23/2021   Hyperlipidemia 12/23/2021   Insomnia 12/23/2021   Abnormal weight gain 10/28/2020   Change in bowel habit 10/28/2020   Diverticular disease of colon 10/28/2020   Diverticulosis of large intestine without perforation or abscess without bleeding 10/28/2020   Dysphagia 10/28/2020   Epigastric pain 10/28/2020   Periumbilical pain 10/28/2020   Morbid obesity (HCC) 10/28/2020   Nausea 10/28/2020   Right lower quadrant pain 10/28/2020   Pain due to onychomycosis of toenails of both feet 10/28/2020   HTN (hypertension) 08/07/2014   TIA (transient ischemic attack) 08/07/2014   GERD (gastroesophageal reflux disease)    Arthritis     PCP: Charlene Conners MD  REFERRING PROVIDER: Rance Burrows, MD  REFERRING DIAG: M25.511 (ICD-10-CM) - Right  shoulder pain  THERAPY DIAG:  Chronic right shoulder pain  Muscle weakness (generalized)  Rationale for Evaluation and Treatment: Rehabilitation  ONSET DATE: fall almost 48 years ago  SUBJECTIVE:                                                                                                                                                                                      SUBJECTIVE STATEMENT: Pt reports improvements in ROM and no pain at start of session. Doorway Pec stretch from last session elicited minor pain in between sessions. No questions or concerns at  this time.  Hand dominance: Right  PERTINENT HISTORY: Pt reports falling onto R shoulder during a pregnancy ~48 years ago at work. Has had prior bouts of PT and saw MD. Reports not a lot of success with PT. Her R shoulder when she was younger she had little to no issues. As she has gotten older it has bothered her more. Reports difficulty with sleep. Actually able to sleep on her R side. More issues with sleeping on her L side with shoulder propped on pillow. Unable to elevate her arm very high. Has weakness in RUE and pain. Described as dull and achey. Catching pain with overhead ranges. Has to use LUE to assist RUE primarily when her pain is bad. Has difficulty with doing her hair. Reports most difficulty with weighted ADL's like cooking and putting dishes away over head. Some difficulty reaching behind her back primarily with bathing ADL's. Does report hand numbness holding her phone. Has referred pain down lateral arm to her elbow. Reports similar pain in LUE thinking do to compensation for RUE. Reports worst pain upwards to 8-9/10 NPS at night. 3-4/10 NPS during the day with UE use.   PAIN:  Are you having pain? Yes: NPRS scale: 0/10 NPS Pain location: anterior and superior R shoulder pain Pain description: dull/achey/catching Aggravating factors: L side lying, overhead R shoulder ranges Relieving factors: Rest  PRECAUTIONS:  None  RED FLAGS: None   WEIGHT BEARING RESTRICTIONS: No  FALLS:  Has patient fallen in last 6 months? No  LIVING ENVIRONMENT: Lives with: lives with their spouse  OCCUPATION: Retired Char[employer] fundraiser  PLOF: Independent  PATIENT GOALS: Improve RUE strength, pain and AROM  NEXT MD VISIT: Within 1-2 months  OBJECTIVE:  Note: Objective measures were completed at Evaluation unless otherwise noted.  DIAGNOSTIC FINDINGS:  N/A  PATIENT SURVEYS :  Quick Dash 61.4%  COGNITION: Overall cognitive status: Within functional limits for tasks assessed     SENSATION: Light touch: WFL  POSTURE: Mildly rounded shoulders  CERVICAL AROM: Flexion: 55 Extension: 30* Rotation R/L: 35/50 Lateral flexion R/L: 25*/20  UPPER EXTREMITY ROM:   Active ROM Right eval Left eval  Shoulder flexion 98* (162 PROM) 135  Shoulder extension    Shoulder abduction 96* (140 PROM) 140  Shoulder adduction    Shoulder internal rotation 88 90  Shoulder external rotation 65* 78  Elbow flexion    Elbow extension    Wrist flexion    Wrist extension    Wrist ulnar deviation    Wrist radial deviation    Wrist pronation    Wrist supination    (Blank rows = not tested)  UPPER EXTREMITY MMT:  MMT Right eval Left eval  Shoulder flexion 3+* 4  Shoulder extension    Shoulder abduction 3+* 4  Shoulder adduction    Shoulder internal rotation 4* 5  Shoulder external rotation 3+ 5  Middle trapezius    Lower trapezius    Elbow flexion 4 4  Elbow extension 3 4  Wrist flexion    Wrist extension    Wrist ulnar deviation    Wrist radial deviation    Wrist pronation    Wrist supination    Grip strength (lbs)    (Blank rows = not tested)  SHOULDER SPECIAL TESTS: Impingement tests: Hawkins/Kennedy impingement test: positive  Rotator cuff assessment: Empty can test: positive , Full can test: positive , and Infraspinatus test: positive   Scapular assist test: positive   CERVICAL SPECIAL TESTS: Spurling's:  A and  B negative   WRIST SPECIAL TESTS: Phalen's: negative bilat  JOINT MOBILITY TESTING:  GHJ AP/PA/inferior: normal and painless in all planes.  PALPATION:  TTP along long head of biceps tendon and supraspinatus tendon.                                                                                                                             TREATMENT DATE: 01/01/2024  There.Ex:   UBE x 4 min, 2 min CC then 2 min CCW for UE warm up;   Standing Elbow Flexion (RUE 5# DB/ LUE 6#DB): 2 x 10:  OMEGA Cable Machine: Low Row 2 x 8 20#   Lat Pull Down 2 x 10 20#, good carry over from last session, tempo controlled  PT demo with pt return demonstration of:  Doorway Pec Stretch at 60 Elevation (RUE Only): 2 x 30s  Biceps Stretch (Shoulder extension with elbow extension): 2 x 30s   Seated Shoulder Flexion with Supination (Very Weak):   1 x 5: 4# DB with minor pain   2 x 10: 3# DB   Quick DASH (In between sets):   01/01/2024: 22.7%  NPS (Worst in Last 7 Days):   5/10   There.act:  AAROM Shoulder Flexion with Short PVC pipe 2 x 10 in order to improve shoulder ROM ;   AAROM Shoulder Reverse Press behind Head with PVC 2 x 5 in order to improve shoulder ROM  Standing High Row with Shoulders at 90 Abd: 1 x 10: (Pain in anterior shoulder):  PATIENT EDUCATION: Education details: POC, prognosis, HEP Person educated: Patient Education method: Explanation, Demonstration, and Handouts Education comprehension: verbalized understanding and returned demonstration  HOME EXERCISE PROGRAM: Access Code: [email or web address] URL: [email or web address] Date: 12/18/2023 Prepared by: Satira Curet  Exercises - Seated Shoulder Flexion AAROM with Pulley Behind  - 1 x daily - 7 x weekly - 2 sets - 15 reps - Scapular Retraction with Resistance  - 1 x daily - 7 x weekly - 3 sets - 12 reps - Sidelying Shoulder External Rotation  - 1 x daily - 3-4 x weekly - 3 sets - 5 reps - Seated  Shoulder Flexion AAROM with Dowel  - 1 x daily - 7 x weekly - 3 sets - 10 reps - Standing Single Arm Bicep Curls Supinated with Dumbbell  - 1 x daily - 3-4 x weekly - 3 sets - 10 reps  Access Code: [email or web address] URL: [email or web address] Date: 12/01/2023 Prepared by: Lyda Samples  Exercises - Seated Shoulder Flexion AAROM with Pulley Behind  - 1 x daily - 7 x weekly - 2 sets - 15 reps - Scapular Retraction with Resistance  - 1 x daily - 7 x weekly - 3 sets - 12 reps - Sidelying Shoulder External Rotation  - 1 x daily - 3-4 x weekly - 3 sets - 5 reps  ASSESSMENT:  CLINICAL IMPRESSION: Patient arrived to OPPT with a main focus on improving R  shoulder strength and AROM. Pt tolerated an increase with resistance/repetitions in today's interventions without report of additional pain. Patient continues to respond well to the current treatement regiment focused on improving R shoulder strength and ROM. Pt is making steady progress toward establised goals at this time.  Patient reports that biggest barrier due to her R shoulder is washing behind her back and loading the shoulder with weight. Pt has demonstrated improvements in overall disability and pain. as indicated in her QuickDASH/NPS score (See above). Patient's condition has the potential to improve in response to therapy. Maximum improvement is yet to be obtained. The anticipated improvement is attainable and reasonable in a generally predictable time. Based on today's performance, current POC remains appropriate and pt will continue to benefit from skilled physical therapy focused on improving strength, ROM, overhead activities and maximizing return to PLOF.   OBJECTIVE IMPAIRMENTS: decreased mobility, decreased ROM, decreased strength, impaired UE functional use, postural dysfunction, and pain.   ACTIVITY LIMITATIONS: carrying, lifting, sleeping, bathing, reach over head, and hygiene/grooming  PARTICIPATION LIMITATIONS: cleaning,  laundry, and shopping  PERSONAL FACTORS: Age, Past/current experiences, Profession, and Time since onset of injury/illness/exacerbation are also affecting patient's functional outcome.   REHAB POTENTIAL: Fair chronicity of symptoms  CLINICAL DECISION MAKING: Evolving/moderate complexity  EVALUATION COMPLEXITY: Moderate  GOALS: Goals reviewed with patient? No  SHORT TERM GOALS: Target date: 12/05/23  Pt will be independent with HEP to improve RUE pain and ROM for overhead ADL's Baseline: 2/18.25: provided initial HEP; 01/01/2024: Able to perform/demo HEP with minimal cues  Goal status: MET  LONG TERM GOALS: Target date: 01/29/2024  Pt will improve QuickDash by at least 10 points to demonstrate clinically significant improvement in reduced disability due to RUE impairments.  Baseline: 11/07/23: 61.4%; 01/01/2024: 22.7% Goal status: MET  2.  Pt will improve R shoulder flexion to at least 120 degrees to demonstrate clinically significant improvement in RUE use for overhead ADL's. Baseline: 11/07/23: 98 degrees: 01/01/2024: ~120 (Assisted) Goal status: IN PROGRESS  3.  Pt will report worst pain as a 6/10 NPS or less with cooking and other overhead ADL's to demonstrate clinically significant reduction in pain.  Baseline: 11/08/23: 8/10 NPS: 01/01/2024: 5/10 NPS:  Goal status: IN PROGRESS PLAN: PT FREQUENCY: 1-2x/week  PT DURATION: 4 weeks  PLANNED INTERVENTIONS: 97164- PT Re-evaluation, 97110-Therapeutic exercises, 97530- Therapeutic activity, 97112- Neuromuscular re-education, 97535- Self Care, 54098- Manual therapy, [id number]- Electrical stimulation (unattended), [phone]- Electrical stimulation (manual), Patient/Family education, Dry Needling, Joint mobilization, Joint manipulation, Spinal manipulation, Spinal mobilization, Cryotherapy, and Moist heat  PLAN FOR NEXT SESSION: R Shoulder Strength, R Elbow Strength, Manual mobilization PRN for pain.   Satira Curet PT, DPT Physical  Therapist- [facility]  01/01/2024, 10:46 AM

## 2024-01-04 ENCOUNTER — Ambulatory Visit: Payer: Medicare HMO

## 2024-01-04 DIAGNOSIS — G8929 Other chronic pain: Secondary | ICD-10-CM

## 2024-01-04 DIAGNOSIS — M25511 Pain in right shoulder: Secondary | ICD-10-CM | POA: Diagnosis not present

## 2024-01-04 DIAGNOSIS — M6281 Muscle weakness (generalized): Secondary | ICD-10-CM

## 2024-01-04 NOTE — Therapy (Signed)
 OUTPATIENT PHYSICAL THERAPY UPPER EXTREMITY TREATMENT   Patient Name: Martha Chambers MRN: [id number] DOB:03-31-1951, 73 y.o., female Today's Date: 01/04/2024  END OF SESSION:  PT End of Session - 01/04/24 1347     Visit Number 9    Number of Visits 25    Date for PT Re-Evaluation 01/29/24    PT Start Time 1346    PT Stop Time 1425    PT Time Calculation (min) 39 min    Activity Tolerance Patient tolerated treatment well    Behavior During Therapy WFL for tasks assessed/performed              Past Medical History:  Diagnosis Date   Arthritis    GERD (gastroesophageal reflux disease)    Hypertension    Past Surgical History:  Procedure Laterality Date   ABDOMINAL HYSTERECTOMY     CHOLECYSTECTOMY     KNEE ARTHROPLASTY  07   rt   TONSILLECTOMY     TOTAL KNEE ARTHROPLASTY  09/21/2011   Procedure: TOTAL KNEE ARTHROPLASTY;  Surgeon: Ferd Householder, MD;  Location: [facility] OR;  Service: Orthopedics;  Laterality: Left;  120 MINUTES FOR THIS SURGERY   Patient Active Problem List   Diagnosis Date Noted   Bilateral primary osteoarthritis of knee 12/23/2021   Celiac disease 12/23/2021   Hyperglycemia 12/23/2021   Hyperlipidemia 12/23/2021   Insomnia 12/23/2021   Abnormal weight gain 10/28/2020   Change in bowel habit 10/28/2020   Diverticular disease of colon 10/28/2020   Diverticulosis of large intestine without perforation or abscess without bleeding 10/28/2020   Dysphagia 10/28/2020   Epigastric pain 10/28/2020   Periumbilical pain 10/28/2020   Morbid obesity (HCC) 10/28/2020   Nausea 10/28/2020   Right lower quadrant pain 10/28/2020   Pain due to onychomycosis of toenails of both feet 10/28/2020   HTN (hypertension) 08/07/2014   TIA (transient ischemic attack) 08/07/2014   GERD (gastroesophageal reflux disease)    Arthritis     PCP: Charlene Conners MD  REFERRING PROVIDER: Rance Burrows, MD  REFERRING DIAG: M25.511 (ICD-10-CM) - Right shoulder  pain  THERAPY DIAG:  Chronic right shoulder pain  Muscle weakness (generalized)  Rationale for Evaluation and Treatment: Rehabilitation  ONSET DATE: fall almost 48 years ago  SUBJECTIVE:                                                                                                                                                                                      SUBJECTIVE STATEMENT: Pt reports continued adherence with HEP. No pain at the start of session. No questions or concerns at start of session. Hand dominance: Right  PERTINENT HISTORY: Pt reports  falling onto R shoulder during a pregnancy ~48 years ago at work. Has had prior bouts of PT and saw MD. Reports not a lot of success with PT. Her R shoulder when she was younger she had little to no issues. As she has gotten older it has bothered her more. Reports difficulty with sleep. Actually able to sleep on her R side. More issues with sleeping on her L side with shoulder propped on pillow. Unable to elevate her arm very high. Has weakness in RUE and pain. Described as dull and achey. Catching pain with overhead ranges. Has to use LUE to assist RUE primarily when her pain is bad. Has difficulty with doing her hair. Reports most difficulty with weighted ADL's like cooking and putting dishes away over head. Some difficulty reaching behind her back primarily with bathing ADL's. Does report hand numbness holding her phone. Has referred pain down lateral arm to her elbow. Reports similar pain in LUE thinking do to compensation for RUE. Reports worst pain upwards to 8-9/10 NPS at night. 3-4/10 NPS during the day with UE use.   PAIN:  Are you having pain? Yes: NPRS scale: 0/10 NPS Pain location: anterior and superior R shoulder pain Pain description: dull/achey/catching Aggravating factors: L side lying, overhead R shoulder ranges Relieving factors: Rest  PRECAUTIONS: None  RED FLAGS: None   WEIGHT BEARING RESTRICTIONS: No  FALLS:   Has patient fallen in last 6 months? No  LIVING ENVIRONMENT: Lives with: lives with their spouse  OCCUPATION: Retired Charity fundraiser  PLOF: Independent  PATIENT GOALS: Improve RUE strength, pain and AROM  NEXT MD VISIT: Within 1-2 months  OBJECTIVE:  Note: Objective measures were completed at Evaluation unless otherwise noted.  DIAGNOSTIC FINDINGS:  N/A  PATIENT SURVEYS :  Quick Dash 61.4%  COGNITION: Overall cognitive status: Within functional limits for tasks assessed     SENSATION: Light touch: WFL  POSTURE: Mildly rounded shoulders  CERVICAL AROM: Flexion: 55 Extension: 30* Rotation R/L: 35/50 Lateral flexion R/L: 25*/20  UPPER EXTREMITY ROM:   Active ROM Right eval Left eval  Shoulder flexion 98* (162 PROM) 135  Shoulder extension    Shoulder abduction 96* (140 PROM) 140  Shoulder adduction    Shoulder internal rotation 88 90  Shoulder external rotation 65* 78  Elbow flexion    Elbow extension    Wrist flexion    Wrist extension    Wrist ulnar deviation    Wrist radial deviation    Wrist pronation    Wrist supination    (Blank rows = not tested)  UPPER EXTREMITY MMT:  MMT Right eval Left eval  Shoulder flexion 3+* 4  Shoulder extension    Shoulder abduction 3+* 4  Shoulder adduction    Shoulder internal rotation 4* 5  Shoulder external rotation 3+ 5  Middle trapezius    Lower trapezius    Elbow flexion 4 4  Elbow extension 3 4  Wrist flexion    Wrist extension    Wrist ulnar deviation    Wrist radial deviation    Wrist pronation    Wrist supination    Grip strength (lbs)    (Blank rows = not tested)  SHOULDER SPECIAL TESTS: Impingement tests: Hawkins/Kennedy impingement test: positive  Rotator cuff assessment: Empty can test: positive , Full can test: positive , and Infraspinatus test: positive   Scapular assist test: positive   CERVICAL SPECIAL TESTS: Spurling's: A and B negative   WRIST SPECIAL TESTS: Phalen's: negative  bilat  JOINT MOBILITY TESTING:  GHJ AP/PA/inferior: normal and painless in all planes.  PALPATION:  TTP along long head of biceps tendon and supraspinatus tendon.                                                                                                                             TREATMENT DATE: 01/04/2024  There.Ex:   UBE x 5 min x 2.5 Forward x 2.5 Backwards x Level 4-6 for warm and AAROM of RUE; Pt manually adjusted resistance  OMEGA Cable Machine: Low Row 2 x 10 25#   Lat Pull Down 2 x 10 20#  Elbow Flexion BUE:   3 x 10 (RUE 4#, LUE 5#)   Passive Supine Shoulder Internal rotation stretch:  30s/bout x 3 in order to improve ROM and tissue extensibility  PT demo and pt return demonstration of bicep stretch at table and wall   At edge of table 1 x 30s  At wall  1 x 30s   There.act:  Seated R Shoulder ER with Bolster Support at 90  1 x 10, 2# DB (Minor Pain)  2  x 10, 1# DB  Standing R shoulder Rhythmic Stabilization at 90 (supported with green ball on wall)   2 x 30s (external perturbation at distal wrist)   1 x 15s  Standing D1 Flexion Pattern with DB (RUE only)   1 x 10 1# DB  1 x 10 2# DB  PATIENT EDUCATION: Education details: POC, prognosis, HEP Person educated: Patient Education method: Explanation, Demonstration, and Handouts Education comprehension: verbalized understanding and returned demonstration  HOME EXERCISE PROGRAM: Access Code: [email or web address] URL: [email or web address] Date: 12/18/2023 Prepared by: Satira Curet  Exercises - Seated Shoulder Flexion AAROM with Pulley Behind  - 1 x daily - 7 x weekly - 2 sets - 15 reps - Scapular Retraction with Resistance  - 1 x daily - 7 x weekly - 3 sets - 12 reps - Sidelying Shoulder External Rotation  - 1 x daily - 3-4 x weekly - 3 sets - 5 reps - Seated Shoulder Flexion AAROM with Dowel  - 1 x daily - 7 x weekly - 3 sets - 10 reps - Standing Single Arm Bicep Curls Supinated with  Dumbbell  - 1 x daily - 3-4 x weekly - 3 sets - 10 reps  Access Code: [email or web address] URL: [email or web address] Date: 12/01/2023 Prepared by: Lyda Samples  Exercises - Seated Shoulder Flexion AAROM with Pulley Behind  - 1 x daily - 7 x weekly - 2 sets - 15 reps - Scapular Retraction with Resistance  - 1 x daily - 7 x weekly - 3 sets - 12 reps - Sidelying Shoulder External Rotation  - 1 x daily - 3-4 x weekly - 3 sets - 5 reps  ASSESSMENT:  CLINICAL IMPRESSION: Patient arrived to OPPT with a continued focus on improving R GHJ ROM and increasing R shoulder strength. Pt tolerated an increase with resistance/repetitions in today's interventions  without report of additional pain in R Shoulder. She continues to make steady progress towards PT goals. Pt able to achieve ~130 in shoulder flexion without assistance in today's session. Pt still has deficits with reaching behind back and across the body to turn off lamp. Notable muscle tension and trigger points arcoss biceps muscles, time spend with passive stretches in order to decrease tension. PT plans to continue improving global GHJ strength and elbow strength. She still presents with RUE weakness limiting her from full participation with household ADLs and recreational activities. Based on today's performance, current POC remains appropriate and pt will continue to benefit from skilled physical therapy focused on improving GHJ ROM and maximizing return to PLOF.   OBJECTIVE IMPAIRMENTS: decreased mobility, decreased ROM, decreased strength, impaired UE functional use, postural dysfunction, and pain.   ACTIVITY LIMITATIONS: carrying, lifting, sleeping, bathing, reach over head, and hygiene/grooming  PARTICIPATION LIMITATIONS: cleaning, laundry, and shopping  PERSONAL FACTORS: Age, Past/current experiences, Profession, and Time since onset of injury/illness/exacerbation are also affecting patient's functional outcome.   REHAB POTENTIAL:  Fair chronicity of symptoms  CLINICAL DECISION MAKING: Evolving/moderate complexity  EVALUATION COMPLEXITY: Moderate  GOALS: Goals reviewed with patient? No  SHORT TERM GOALS: Target date: 12/05/23  Pt will be independent with HEP to improve RUE pain and ROM for overhead ADL's Baseline: 2/18.25: provided initial HEP; 01/01/2024: Able to perform/demo HEP with minimal cues  Goal status: MET  LONG TERM GOALS: Target date: 01/29/2024  Pt will improve QuickDash by at least 10 points to demonstrate clinically significant improvement in reduced disability due to RUE impairments.  Baseline: 11/07/23: 61.4%; 01/01/2024: 22.7% Goal status: MET  2.  Pt will improve R shoulder flexion to at least 120 degrees to demonstrate clinically significant improvement in RUE use for overhead ADL's. Baseline: 11/07/23: 98 degrees: 01/01/2024: ~120 (Assisted) Goal status: IN PROGRESS  3.  Pt will report worst pain as a 6/10 NPS or less with cooking and other overhead ADL's to demonstrate clinically significant reduction in pain.  Baseline: 11/08/23: 8/10 NPS: 01/01/2024: 5/10 NPS:  Goal status: IN PROGRESS PLAN: PT FREQUENCY: 1-2x/week  PT DURATION: 4 weeks  PLANNED INTERVENTIONS: 97164- PT Re-evaluation, 97110-Therapeutic exercises, 97530- Therapeutic activity, 97112- Neuromuscular re-education, 97535- Self Care, 96045- Manual therapy, G0283- Electrical stimulation (unattended), [phone]- Electrical stimulation (manual), Patient/Family education, Dry Needling, Joint mobilization, Joint manipulation, Spinal manipulation, Spinal mobilization, Cryotherapy, and Moist heat  PLAN FOR NEXT SESSION: R Shoulder Strength, R Elbow Strength, Manual mobilization PRN for pain.   Satira Curet PT, DPT Physical Therapist- [facility]  01/04/2024, 1:47 PM

## 2024-01-08 ENCOUNTER — Ambulatory Visit: Payer: Medicare HMO

## 2024-01-08 DIAGNOSIS — G8929 Other chronic pain: Secondary | ICD-10-CM

## 2024-01-08 DIAGNOSIS — M25511 Pain in right shoulder: Secondary | ICD-10-CM | POA: Diagnosis not present

## 2024-01-08 DIAGNOSIS — M6281 Muscle weakness (generalized): Secondary | ICD-10-CM

## 2024-01-08 NOTE — Therapy (Signed)
 OUTPATIENT PHYSICAL THERAPY UPPER EXTREMITY TREATMENT/PROGRESS NOTE  Dates of reporting period  11/07/2023   to   01/08/2024     Patient Name: Martha Chambers MRN: [id number] DOB:May 07, 1951, 73 y.o., female Today's Date: 01/08/2024  END OF SESSION:  PT End of Session - 01/08/24 1031     Visit Number 10    Number of Visits 25    Date for PT Re-Evaluation 01/29/24    PT Start Time 1031    PT Stop Time 1112    PT Time Calculation (min) 41 min    Activity Tolerance Patient tolerated treatment well    Behavior During Therapy WFL for tasks assessed/performed              Past Medical History:  Diagnosis Date   Arthritis    GERD (gastroesophageal reflux disease)    Hypertension    Past Surgical History:  Procedure Laterality Date   ABDOMINAL HYSTERECTOMY     CHOLECYSTECTOMY     KNEE ARTHROPLASTY  07   rt   TONSILLECTOMY     TOTAL KNEE ARTHROPLASTY  09/21/2011   Procedure: TOTAL KNEE ARTHROPLASTY;  Surgeon: Ferd Householder, MD;  Location: [facility] OR;  Service: Orthopedics;  Laterality: Left;  120 MINUTES FOR THIS SURGERY   Patient Active Problem List   Diagnosis Date Noted   Bilateral primary osteoarthritis of knee 12/23/2021   Celiac disease 12/23/2021   Hyperglycemia 12/23/2021   Hyperlipidemia 12/23/2021   Insomnia 12/23/2021   Abnormal weight gain 10/28/2020   Change in bowel habit 10/28/2020   Diverticular disease of colon 10/28/2020   Diverticulosis of large intestine without perforation or abscess without bleeding 10/28/2020   Dysphagia 10/28/2020   Epigastric pain 10/28/2020   Periumbilical pain 10/28/2020   Morbid obesity (HCC) 10/28/2020   Nausea 10/28/2020   Right lower quadrant pain 10/28/2020   Pain due to onychomycosis of toenails of both feet 10/28/2020   HTN (hypertension) 08/07/2014   TIA (transient ischemic attack) 08/07/2014   GERD (gastroesophageal reflux disease)    Arthritis     PCP: Charlene Conners MD  REFERRING PROVIDER: Rance Burrows, MD  REFERRING DIAG: M25.511 (ICD-10-CM) - Right shoulder pain  THERAPY DIAG:  Chronic right shoulder pain  Muscle weakness (generalized)  Rationale for Evaluation and Treatment: Rehabilitation  ONSET DATE: fall almost 48 years ago  SUBJECTIVE:                                                                                                                                                                                      SUBJECTIVE STATEMENT: Pt reports overall  improved sleep, ROM, strength. Pt able to lift a  case of water (16.9oz bottles). No question or concerns.  Hand dominance: Right  PERTINENT HISTORY: Pt reports falling onto R shoulder during a pregnancy ~48 years ago at work. Has had prior bouts of PT and saw MD. Reports not a lot of success with PT. Her R shoulder when she was younger she had little to no issues. As she has gotten older it has bothered her more. Reports difficulty with sleep. Actually able to sleep on her R side. More issues with sleeping on her L side with shoulder propped on pillow. Unable to elevate her arm very high. Has weakness in RUE and pain. Described as dull and achey. Catching pain with overhead ranges. Has to use LUE to assist RUE primarily when her pain is bad. Has difficulty with doing her hair. Reports most difficulty with weighted ADL's like cooking and putting dishes away over head. Some difficulty reaching behind her back primarily with bathing ADL's. Does report hand numbness holding her phone. Has referred pain down lateral arm to her elbow. Reports similar pain in LUE thinking do to compensation for RUE. Reports worst pain upwards to 8-9/10 NPS at night. 3-4/10 NPS during the day with UE use.   PAIN:  Are you having pain? Yes: NPRS scale: 0/10 NPS Pain location: anterior and superior R shoulder pain Pain description: dull/achey/catching Aggravating factors: L side lying, overhead R shoulder ranges Relieving factors:  Rest  PRECAUTIONS: None  RED FLAGS: None   WEIGHT BEARING RESTRICTIONS: No  FALLS:  Has patient fallen in last 6 months? No  LIVING ENVIRONMENT: Lives with: lives with their spouse  OCCUPATION: Retired [employer] fundraiser  PLOF: Independent  PATIENT GOALS: Improve RUE strength, pain and AROM  NEXT MD VISIT: Within 1-2 months  OBJECTIVE:  Note: Objective measures were completed at Evaluation unless otherwise noted.  DIAGNOSTIC FINDINGS:  N/A  PATIENT SURVEYS :  Quick Dash 61.4%  COGNITION: Overall cognitive status: Within functional limits for tasks assessed     SENSATION: Light touch: WFL  POSTURE: Mildly rounded shoulders  CERVICAL AROM: Flexion: 55 Extension: 30* Rotation R/L: 35/50 Lateral flexion R/L: 25*/20  UPPER EXTREMITY ROM:   Active ROM Right eval Left eval  Shoulder flexion 98* (162 PROM) 135  Shoulder extension    Shoulder abduction 96* (140 PROM) 140  Shoulder adduction    Shoulder internal rotation 88 90  Shoulder external rotation 65* 78  Elbow flexion    Elbow extension    Wrist flexion    Wrist extension    Wrist ulnar deviation    Wrist radial deviation    Wrist pronation    Wrist supination    (Blank rows = not tested)  UPPER EXTREMITY MMT:  MMT Right eval Left eval  Shoulder flexion 3+* 4  Shoulder extension    Shoulder abduction 3+* 4  Shoulder adduction    Shoulder internal rotation 4* 5  Shoulder external rotation 3+ 5  Middle trapezius    Lower trapezius    Elbow flexion 4 4  Elbow extension 3 4  Wrist flexion    Wrist extension    Wrist ulnar deviation    Wrist radial deviation    Wrist pronation    Wrist supination    Grip strength (lbs)    (Blank rows = not tested)  SHOULDER SPECIAL TESTS: Impingement tests: Hawkins/Kennedy impingement test: positive  Rotator cuff assessment: Empty can test: positive , Full can test: positive , and Infraspinatus test: positive   Scapular assist test: positive  CERVICAL  SPECIAL TESTS: Spurling's: A and B negative   WRIST SPECIAL TESTS: Phalen's: negative bilat  JOINT MOBILITY TESTING:  GHJ AP/PA/inferior: normal and painless in all planes.  PALPATION:  TTP along long head of biceps tendon and supraspinatus tendon.                                                                                                                             TREATMENT DATE: 01/08/2024  There.Ex:   OMEGA Cable Machine:  Lat Pull Down 2 x 10 20# (Short Bar)   Standing Elbow Flexion RUE only:   1 x 10 3# DB  2 x 10 5# DB  Standing Rest break in between each set due to UE fatigue  There.act:  Standing D1 Flexion Pattern with DB (RUE only)   2 x 10 2# DB  Standing R GHJ Internal Rotation Walkout against Red TB (R UE at 90-90):  1 x 10; (moderate pain in anterior shoulder)   Standing R GHJ Internal Rotation (GHJ Neutral, Elbow Supported with towel roll):   2 x 12 against Green TB  Standing R GHJ External Rotation Against resistance (GHJ Neutral, Elbow Supported with towel roll):   1 x 10 against Green TB   1 x 12 against red TB   Reassessed ROM:  R Shoulder ROM: 130 AROM  Quick Dash:  11.4 / 100 = 11.4 %        PATIENT EDUCATION: Education details: POC, prognosis, HEP Person educated: Patient Education method: Programmer, multimedia, Demonstration, and Handouts Education comprehension: verbalized understanding and returned demonstration  HOME EXERCISE PROGRAM: Access Code: [email or web address] URL: [email or web address] Date: 12/18/2023 Prepared by: Satira Curet  Exercises - Seated Shoulder Flexion AAROM with Pulley Behind  - 1 x daily - 7 x weekly - 2 sets - 15 reps - Scapular Retraction with Resistance  - 1 x daily - 7 x weekly - 3 sets - 12 reps - Sidelying Shoulder External Rotation  - 1 x daily - 3-4 x weekly - 3 sets - 5 reps - Seated Shoulder Flexion AAROM with Dowel  - 1 x daily - 7 x weekly - 3 sets - 10 reps - Standing Single Arm Bicep  Curls Supinated with Dumbbell  - 1 x daily - 3-4 x weekly - 3 sets - 10 reps  Access Code: [email or web address] URL: [email or web address] Date: 12/01/2023 Prepared by: Lyda Samples  Exercises - Seated Shoulder Flexion AAROM with Pulley Behind  - 1 x daily - 7 x weekly - 2 sets - 15 reps - Scapular Retraction with Resistance  - 1 x daily - 7 x weekly - 3 sets - 12 reps - Sidelying Shoulder External Rotation  - 1 x daily - 3-4 x weekly - 3 sets - 5 reps  ASSESSMENT:  CLINICAL IMPRESSION: Pt arrived to OPPT with main focus on reassessment of progress towards PT goals and management of ROM/strength. The patient has demonstrated significant progress throughout her POC in  R GHJ ROM, Strength and pain. She self reported 11.4% on the QuickDASH which indicates improvements in disability and pain in her R GHJ. Her greatest deficit is washing her back with her RUE; PT educated patient on using back scrubber. She demonstrated significant improvements in ROM, her ROM increased from 120 to 130 AROM. Remainder of today's session focused on functional strengthening for her R GHJ and elbow. Pt tolerated today's interventions without report of additional pain in R shoulder. Overall, the patient has demonstrated significant progress towards her own goals and PT goals. She still presents with moderate weakness in her R shoulder limiting her from full participation with ADLs such as washing her back or doing heavy household chores. Based on today's performance, current POC remains appropriate and pt will continue to benefit from skilled physical therapy focused on improving GHJ ROM and maximizing return to PLOF.   OBJECTIVE IMPAIRMENTS: decreased mobility, decreased ROM, decreased strength, impaired UE functional use, postural dysfunction, and pain.   ACTIVITY LIMITATIONS: carrying, lifting, sleeping, bathing, reach over head, and hygiene/grooming  PARTICIPATION LIMITATIONS: cleaning, laundry, and  shopping  PERSONAL FACTORS: Age, Past/current experiences, Profession, and Time since onset of injury/illness/exacerbation are also affecting patient's functional outcome.   REHAB POTENTIAL: Fair chronicity of symptoms  CLINICAL DECISION MAKING: Evolving/moderate complexity  EVALUATION COMPLEXITY: Moderate  GOALS: Goals reviewed with patient? No  SHORT TERM GOALS: Target date: 12/05/23  Pt will be independent with HEP to improve RUE pain and ROM for overhead ADL's Baseline: 2/18.25: provided initial HEP; 01/01/2024: Able to perform/demo HEP with minimal cues  Goal status: MET  LONG TERM GOALS: Target date: 01/29/2024  Pt will improve QuickDash by at least 10 points to demonstrate clinically significant improvement in reduced disability due to RUE impairments.  Baseline: 11/07/23: 61.4%; 01/01/2024: 22.7%: 01/08/2024: 11.4 / 100 = 11.4 % Goal status: MET  2.  Pt will improve R shoulder flexion to at least 120 degrees to demonstrate clinically significant improvement in RUE use for overhead ADL's. Baseline: 11/07/23: 98 degrees: 01/01/2024: ~120 (Assisted): 01/08/2024: 130 Goal status: IN PROGRESS  3.  Pt will report worst pain as a 6/10 NPS or less with cooking and other overhead ADL's to demonstrate clinically significant reduction in pain.  Baseline: 11/08/23: 8/10 NPS: 01/01/2024: 5/10 NPS: 01/08/2024: 6/10  Goal status: IN PROGRESS PLAN: PT FREQUENCY: 1-2x/week  PT DURATION: 4 weeks  PLANNED INTERVENTIONS: 97164- PT Re-evaluation, 97110-Therapeutic exercises, 97530- Therapeutic activity, 97112- Neuromuscular re-education, 97535- Self Care, [id number]- Manual therapy, [id number]- Electrical stimulation (unattended), 97032- Electrical stimulation (manual), Patient/Family education, Dry Needling, Joint mobilization, Joint manipulation, Spinal manipulation, Spinal mobilization, Cryotherapy, and Moist heat  PLAN FOR NEXT SESSION: R Shoulder Strength, R Elbow Strength, Manual mobilization PRN  for pain.   Satira Curet PT, DPT Physical Therapist- [facility]  01/08/2024, 12:25 PM

## 2024-01-11 ENCOUNTER — Ambulatory Visit: Payer: Medicare HMO

## 2024-01-15 ENCOUNTER — Ambulatory Visit: Payer: Medicare HMO

## 2024-01-18 ENCOUNTER — Ambulatory Visit: Payer: Medicare HMO

## 2024-01-22 ENCOUNTER — Ambulatory Visit: Payer: Medicare HMO

## 2024-01-24 ENCOUNTER — Ambulatory Visit: Payer: Medicare HMO

## 2024-04-09 ENCOUNTER — Other Ambulatory Visit (HOSPITAL_COMMUNITY): Payer: Self-pay | Admitting: Internal Medicine

## 2024-04-09 DIAGNOSIS — R0601 Orthopnea: Secondary | ICD-10-CM

## 2024-04-25 ENCOUNTER — Ambulatory Visit (HOSPITAL_COMMUNITY)
Admission: RE | Admit: 2024-04-25 | Discharge: 2024-04-25 | Disposition: A | Discharge status: Home / Self Care | Source: Ambulatory Visit | Attending: Pediatric Surgery | Admitting: Pediatric Surgery

## 2024-04-25 DIAGNOSIS — R0601 Orthopnea: Secondary | ICD-10-CM | POA: Insufficient documentation

## 2024-04-25 DIAGNOSIS — I358 Other nonrheumatic aortic valve disorders: Secondary | ICD-10-CM | POA: Insufficient documentation

## 2024-04-25 DIAGNOSIS — I3481 Nonrheumatic mitral (valve) annulus calcification: Secondary | ICD-10-CM | POA: Diagnosis not present

## 2024-04-25 DIAGNOSIS — R0609 Other forms of dyspnea: Secondary | ICD-10-CM | POA: Diagnosis not present

## 2024-04-25 LAB — ECHOCARDIOGRAM COMPLETE
Area-P 1/2: 4.06 cm2
Calc EF: 77.8 %
S' Lateral: 2.5 cm
Single Plane A2C EF: 78.5 %
Single Plane A4C EF: 78.1 %

## 2024-07-22 ENCOUNTER — Ambulatory Visit: Admitting: Neurology
